# Patient Record
Sex: Female | Born: 1945 | Race: Black or African American | Hispanic: No | Smoking: Current every day smoker
Health system: Southern US, Community
[De-identification: ages and names within clinical notes are randomized; demographics above are authoritative.]

## PROBLEM LIST (undated history)

## (undated) DIAGNOSIS — I6529 Occlusion and stenosis of unspecified carotid artery: Secondary | ICD-10-CM

## (undated) DIAGNOSIS — R911 Solitary pulmonary nodule: Secondary | ICD-10-CM

## (undated) DIAGNOSIS — I10 Essential (primary) hypertension: Secondary | ICD-10-CM

## (undated) DIAGNOSIS — I2510 Atherosclerotic heart disease of native coronary artery without angina pectoris: Secondary | ICD-10-CM

## (undated) DIAGNOSIS — Z720 Tobacco use: Secondary | ICD-10-CM

## (undated) DIAGNOSIS — R200 Anesthesia of skin: Secondary | ICD-10-CM

## (undated) DIAGNOSIS — I219 Acute myocardial infarction, unspecified: Secondary | ICD-10-CM

## (undated) DIAGNOSIS — E785 Hyperlipidemia, unspecified: Secondary | ICD-10-CM

## (undated) HISTORY — DX: Tobacco use: Z72.0

## (undated) HISTORY — DX: Anesthesia of skin: R20.0

## (undated) HISTORY — DX: Essential (primary) hypertension: I10

## (undated) HISTORY — DX: Occlusion and stenosis of unspecified carotid artery: I65.29

## (undated) HISTORY — PX: HIATAL HERNIA REPAIR: SHX195

## (undated) HISTORY — PX: CORONARY STENT PLACEMENT: SHX1402

## (undated) HISTORY — DX: Acute myocardial infarction, unspecified: I21.9

## (undated) HISTORY — DX: Atherosclerotic heart disease of native coronary artery without angina pectoris: I25.10

## (undated) HISTORY — DX: Hyperlipidemia, unspecified: E78.5

## (undated) HISTORY — DX: Solitary pulmonary nodule: R91.1

## (undated) HISTORY — PX: OTHER SURGICAL HISTORY: SHX169

## (undated) HISTORY — PX: ABDOMINAL HYSTERECTOMY: SHX81

---
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
## 2000-02-01 ENCOUNTER — Encounter: Payer: Self-pay | Admitting: Emergency Medicine

## 2000-02-01 ENCOUNTER — Emergency Department (HOSPITAL_COMMUNITY): Admission: EM | Admit: 2000-02-01 | Discharge: 2000-02-01 | Payer: Self-pay

## 2002-07-01 ENCOUNTER — Emergency Department (HOSPITAL_COMMUNITY): Admission: EM | Admit: 2002-07-01 | Discharge: 2002-07-01 | Payer: Self-pay | Admitting: Emergency Medicine

## 2002-07-02 ENCOUNTER — Encounter: Payer: Self-pay | Admitting: Emergency Medicine

## 2007-11-16 ENCOUNTER — Ambulatory Visit: Payer: Self-pay | Admitting: Vascular Surgery

## 2007-11-16 ENCOUNTER — Encounter: Payer: Self-pay | Admitting: Cardiovascular Disease

## 2007-11-16 ENCOUNTER — Inpatient Hospital Stay (HOSPITAL_COMMUNITY): Admission: EM | Admit: 2007-11-16 | Discharge: 2007-11-18 | Payer: Self-pay | Admitting: *Deleted

## 2007-11-16 ENCOUNTER — Ambulatory Visit: Payer: Self-pay | Admitting: Cardiovascular Disease

## 2007-11-17 ENCOUNTER — Encounter: Payer: Self-pay | Admitting: Cardiovascular Disease

## 2010-07-28 NOTE — H&P (Signed)
[name], VARANO NO.:  [id number]   MEDICAL RECORD NO.:  [id number]          PATIENT TYPE:  INP   LOCATION:  1435                         FACILITY:  [facility]   PHYSICIAN:  Eduard Clos, MDDATE OF BIRTH:  07-08-1945   DATE OF ADMISSION:  11/15/2007  DATE OF DISCHARGE:                              HISTORY & PHYSICAL   CHIEF COMPLAINT:  Chest pain.   HISTORY OF PRESENT ILLNESS:  A 65 year old female with history of CAD  status post stenting in 1996 at [facility] with history of ongoing  tobacco abuse, presented to the ER complaining of chest pain.  The  patient states she has been experiencing chest pain for the last three  days, each time lasting up to 3 minutes, occurring at rest, not related  to exertion.  Chest pain is more like a pressure type, occurs sternal,  sometimes radiating to the left arm.  No associated palpitations,  shortness of breath, nausea, vomiting, diaphoresis, dizziness or loss of  consciousness.  Thus, the patient is chest pain free.  The patient  denies any abdominal pain, nausea, nausea, vomiting, fever, chills,  headache, dysuria, or diarrhea.  The patient is admitted for further  observation.   PAST MEDICAL HISTORY:  CAD status post stenting.   PAST SURGICAL HISTORY:  Hysterectomy and stenting in 1996 at [facility].   MEDICATIONS ON ADMISSION:  1. Aspirin 81 mg p.o. daily.   ALLERGIES:  No known drug allergies.   FAMILY HISTORY:  Significant for the patient's father had  MI at age 9.   SOCIAL HISTORY:  The patient smokes cigarettes, has been strongly  advised to quit smoking.  Denies any alcohol or drug abuse.   REVIEW OF SYSTEMS:  Per history of present illness, nothing else  significant.   PHYSICAL EXAMINATION:  GENERAL:  The patient was examined at bedside.  Not in acute distress.  VITAL SIGNS:  Blood pressure 130/80, pulse 90 per minute, temperature  98.8, respirations 18, O2 saturation 99%.  HEENT:  Anicteric, no pallor.  CHEST:  Bilateral air entry present, no rhonchi or crepitus.  HEART:  S1, S2 heard.  ABDOMEN:  Soft, nontender, bowel sounds heard.  No guarding or rigidity.  CNS:  The patient is alert, awake, oriented to time, place and person.  Moves all four extremities 5/5.  EXTREMITIES:  Peripheral pulses felt.   LABORATORY DATA:  EKG normal sinus rhythm.  There is no acute ST-T wave  changes.  Chest x-ray, normal chest. CBC:  WBC 6.6, hemoglobin 12.6,  hematocrit 37, platelets 252.  Basic metabolic panel:  Sodium 142,  potassium 3.4, chloride 108, glucose 130, BUN 15, creatinine 1.  Creatinine kinase 135, CK MB 2.5, troponin 9.   ASSESSMENT/PLAN:  1. Chest pain to rule out acute coronary syndrome (ACS) .  2. History of coronary artery disease,  status post stenting in 1996.  3. Ongoing tobacco abuse.   PLAN:  Admit the patient to telemetry.  Will cycle cardiac markers.  Place the patient on aspirin and metoprolol.  Advised the patient to  quit smoking.  Will place  patient on nitroglycerine (NGT). Further  recommendations as the patient evolves.      Eduard Clos, MD  Electronically Signed     ANK/MEDQ  D:  11/16/2007  T:  11/16/2007  Job:  [id number]

## 2010-07-28 NOTE — Discharge Summary (Signed)
[name], AMSLER             ACCOUNT NO.:  [id number]   MEDICAL RECORD NO.:  [id number]         PATIENT TYPE:  LINP   LOCATION:                               FACILITY:  [facility]   PHYSICIAN:  Hind I Elsaid, MD      DATE OF BIRTH:  [birth year]   DATE OF ADMISSION:  11/16/2007  DATE OF DISCHARGE:  11/18/2007                               DISCHARGE SUMMARY   DISCHARGE DIAGNOSES:  1. Atypical chest pain.  2. Hyperlipidemia.  3. Hypertension.  4. Coronary artery disease status post stenting.  5. Left arm numbness felt to be secondary to disk degeneration.  6. Tobacco abuse.   DISCHARGE MEDICATIONS:  1. Aspirin 325 mg daily.  2. Zocor 410 mg daily.  3. Toprol XL 25 mg daily.   PROCEDURES:  1. Myoview, which was negative for ischemia or infarction.  Ejection      fraction 26%.  2. MRI of the brain negative.  3. MRI of the cervicothoracic spine showed disk herniation at C3 and 4      without apparent neurocompression.  Pronounced spondylosis at C4-C5      with canal narrowing and indentation upon the cord.  Bilateral      neuroforaminal stenosis because of osteotic __________ compress      area of C5 nerve root.  There is spondylosis and posterior lateral      disk protrusion at C5.  C6 with foraminal narrowing on the left      that could compress the C6 nerve root.  There is spondylosis and      protruding disk at C6-C7, more pronounced on the right.   HISTORY OF PRESENT ILLNESS:  This is a 65 year old African American  female with a history of coronary artery disease status post stent  presenting with chest pain and left arm numbness.  Numbness may be  relieved by hand movement.  1. Cardiology consulted from [facility] where they recommended a stress      test.  EKG and cardiac enzymes were negative.  Pain looked      atypical.  The stress test was negative as the report as above.      During hospitalization patient had no further chest pain.  2. Left arm numbness, which is  relieved by moving her arm.  MRI did      show evidence of disk bulging and S1 stenosis.  It was felt that      could be the reason for her symptoms.  Patient was asked to follow      with herself and possible referral to Neurosurgery if symptoms get      worse.  At this time patient admitted her symptoms      completely resolved and procedure can be done electively.  We did      not feel the patient needed to stay any longer in the hospital and      further workup can be done as an outpatient.  We felt that the      patient was medically stable for discharge home.      Hind I  Eda [name], MD  Electronically Signed     HIE/MEDQ  D:  11/18/2007  T:  11/19/2007  Job:  [id number]

## 2010-07-28 NOTE — Consult Note (Signed)
[name], [name] NO.:  [id number]   MEDICAL RECORD NO.:  [id number]          PATIENT TYPE:  INP   LOCATION:  1435                         FACILITY:  [facility]   PHYSICIAN:  Veverly Fells. Excell Seltzer, MD  DATE OF BIRTH:  31-Oct-1945   DATE OF CONSULTATION:  11/16/2007  DATE OF DISCHARGE:                                 CONSULTATION   REASON FOR CONSULTATION:  Chest pain.   REQUESTING PHYSICIAN:  Hind I Elsaid, M.D. with the [facility] service.   HISTORY OF PRESENT ILLNESS:  [name] is a very nice 65 year old  woman who presented early this morning with chest pain.  She was  admitted to through the emergency department.  She has a history of  coronary artery disease with stenting in 1996.  She has done well in the  interim until approximately 1 week ago when she began to feel fatigued.  She describes brief episodes of left-sided chest pressure.  The episodes  last approximately 60 seconds.  She also complains of numbness in the  left arm.  Her symptoms are nonexertional.  She has not had chest pain  today.  She also complains of generalized fatigue over the same time  period.  She denies dyspnea, edema, palpitations or near syncope.  She  has had no regular medical care in some time.  She does not take  medication at home.  She reports that she took aspirin for approximately  1 week after her stent in 1996 but has not taken any antiplatelet drugs  since then.  She had GI upset with aspirin.  Her initial EKG and cardiac  enzymes have been negative.   PAST MEDICAL HISTORY:  1. Coronary artery disease with stenting at [facility] in 1996.      No cardiac follow up since.  2. Total hysterectomy, remotely.  3. Tobacco abuse since age 74.   FAMILY HISTORY:  The patient's father has coronary artery disease.  He  is still living.  His CAD first manifested at age 15.  She has a sister  who has diabetes.  Her mother has no coronary history.   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  She  smokes cigarettes.  She does not drink alcohol.   MEDICATIONS:  Aspirin 81 mg daily.  She started taking this just a few  days ago.   ALLERGIES:  None.   REVIEW OF SYSTEMS:  She reports chronic nausea.  This is actually why  she started smoking cigarettes because they eased her nausea after  eating.  All other systems were reviewed and are negative except as  outlined.  She denies any constitutional symptoms.  She has had no  recent upper respiratory symptoms.  No constipation, diarrhea or other  GI symptoms.  As above, a complete 12-point review of systems was  performed and other systems were negative.   PHYSICAL EXAMINATION:  GENERAL:  The patient is alert and oriented.  She  is in no acute distress.  VITAL SIGNS:  Temperature 98.2, heart rate 80, respiratory rate 18,  blood pressure 144/86,  oxygen saturation 100% on room air.  HEENT:  Normal.  NECK:  Normal carotid upstrokes.  There are no bruits.  JVP normal.  No  thyromegaly or thyroid nodules.  LUNGS:  Clear bilaterally.  HEART:  Regular rate and rhythm.  The apex is discrete and nondisplaced.  There are no murmurs or gallops.  ABDOMEN:  Soft and nontender.  No organomegaly, no abdominal bruits.  BACK:  No CVA tenderness.  EXTREMITIES:  Femoral pulses are 2+.  There are no bruits.  Pedal pulses  are 2+ and equal.  There is no clubbing, cyanosis or edema.  SKIN:  Warm and dry.  There is no rash.  LYMPHATICS:  No adenopathy.  NEUROLOGIC:  Cranial nerves II-XII are intact.  Strength is 5/5 and  equal in the arms and legs.   ELECTROCARDIOGRAM:  EKG shows normal sinus rhythm and is within normal  limits.   LABORATORY DATA:  CK-MB 2.1 with a total CK of 101, troponin 0.01.  Three sets of troponins are 0.02, 0.02, 0.01.  CBC is remarkable for a  hemoglobin of 11.18 and hematocrit of 35.9.  Platelet count is 230,000.  White blood cell count 6.5.  Metabolic panel shows a  creatinine of 0.77  and a BUN of 11.  Chest x-ray is normal.   ASSESSMENT:  This is a 65 year old woman with atypical chest pain.  She  has known coronary artery disease, as described above.  She has ruled  out for a myocardial infarction, and she has no high-risk features.  I  would favor an inpatient myocardial perfusion scan.  An exercise Myoview  stress study will be ordered.  If she has a low risk or a normal study,  she can be treated medically.  If significant ischemia is present, she  will require cardiac catheterization.  Recommend continuation of low-  dose aspirin.  Will check a lipid panel and liver function tests to  determine whether she needs a statin for secondary risk reduction in the  setting of her known coronary artery disease.  I discussed tobacco  cessation with the patient.  Agree with CT scan of the brain to rule out  stroke.  Low level of suspicion since she has no findings except  numbness of the left arm.   Will follow up tomorrow at the time of her stress test.  Appreciate the  opportunity to participate in the care [name].  Please feel free  to call with any questions.      Veverly Fells. Excell Seltzer, MD  Electronically Signed     MDC/MEDQ  D:  11/16/2007  T:  11/16/2007  Job:  [id number]

## 2010-09-04 ENCOUNTER — Emergency Department (HOSPITAL_COMMUNITY)
Admission: EM | Admit: 2010-09-04 | Discharge: 2010-09-04 | Disposition: A | Discharge status: Home / Self Care | Payer: No Typology Code available for payment source | Attending: Emergency Medicine | Admitting: Emergency Medicine

## 2010-09-04 ENCOUNTER — Emergency Department (HOSPITAL_COMMUNITY): Payer: Self-pay

## 2010-09-04 DIAGNOSIS — W010XXA Fall on same level from slipping, tripping and stumbling without subsequent striking against object, initial encounter: Secondary | ICD-10-CM | POA: Insufficient documentation

## 2010-09-04 DIAGNOSIS — Y9229 Other specified public building as the place of occurrence of the external cause: Secondary | ICD-10-CM | POA: Insufficient documentation

## 2010-09-04 DIAGNOSIS — S52599A Other fractures of lower end of unspecified radius, initial encounter for closed fracture: Secondary | ICD-10-CM | POA: Insufficient documentation

## 2010-09-04 DIAGNOSIS — I252 Old myocardial infarction: Secondary | ICD-10-CM | POA: Insufficient documentation

## 2010-09-08 ENCOUNTER — Encounter (HOSPITAL_COMMUNITY)
Admission: RE | Admit: 2010-09-08 | Discharge: 2010-09-08 | Disposition: A | Discharge status: Home / Self Care | Payer: No Typology Code available for payment source | Source: Ambulatory Visit | Attending: Orthopedic Surgery | Admitting: Orthopedic Surgery

## 2010-09-08 LAB — COMPREHENSIVE METABOLIC PANEL
ALT: 10 U/L (ref 0–35)
AST: 16 U/L (ref 0–37)
Albumin: 3.3 g/dL — ABNORMAL LOW (ref 3.5–5.2)
Alkaline Phosphatase: 139 U/L — ABNORMAL HIGH (ref 39–117)
BUN: 22 mg/dL (ref 6–23)
CO2: 29 mEq/L (ref 19–32)
Calcium: 9.2 mg/dL (ref 8.4–10.5)
Chloride: 105 mEq/L (ref 96–112)
Creatinine, Ser: 0.8 mg/dL (ref 0.50–1.10)
GFR calc Af Amer: >60 mL/min (ref >60)
GFR calc non Af Amer: >60 mL/min (ref >60)
Glucose, Bld: 105 mg/dL — ABNORMAL HIGH (ref 70–99)
Potassium: 3.9 mEq/L (ref 3.5–5.1)
Sodium: 140 mEq/L (ref 135–145)
Total Bilirubin: 0.2 mg/dL — ABNORMAL LOW (ref 0.3–1.2)
Total Protein: 6.9 g/dL (ref 6.0–8.3)

## 2010-09-08 LAB — CBC
HCT: 37.2 % (ref 36.0–46.0)
Hemoglobin: 12.3 g/dL (ref 12.0–15.0)
MCH: 29.6 pg (ref 26.0–34.0)
MCHC: 33.1 g/dL (ref 30.0–36.0)
MCV: 89.4 fL (ref 78.0–100.0)
Platelets: 258 10*3/uL (ref 150–400)
RBC: 4.16 MIL/uL (ref 3.87–5.11)
RDW: 14.4 % (ref 11.5–15.5)
WBC: 9.8 10*3/uL (ref 4.0–10.5)

## 2010-09-08 LAB — SURGICAL PCR SCREEN
MRSA, PCR: NEGATIVE
Staphylococcus aureus: NEGATIVE

## 2010-09-09 ENCOUNTER — Ambulatory Visit (HOSPITAL_COMMUNITY)
Admission: RE | Admit: 2010-09-09 | Discharge: 2010-09-09 | Disposition: A | Discharge status: Home / Self Care | Payer: Self-pay | Source: Ambulatory Visit | Attending: Orthopedic Surgery | Admitting: Orthopedic Surgery

## 2010-09-09 DIAGNOSIS — W010XXA Fall on same level from slipping, tripping and stumbling without subsequent striking against object, initial encounter: Secondary | ICD-10-CM | POA: Insufficient documentation

## 2010-09-09 DIAGNOSIS — Z01812 Encounter for preprocedural laboratory examination: Secondary | ICD-10-CM | POA: Insufficient documentation

## 2010-09-09 DIAGNOSIS — Y9229 Other specified public building as the place of occurrence of the external cause: Secondary | ICD-10-CM | POA: Insufficient documentation

## 2010-09-09 DIAGNOSIS — Z01810 Encounter for preprocedural cardiovascular examination: Secondary | ICD-10-CM | POA: Insufficient documentation

## 2010-09-09 DIAGNOSIS — S52599A Other fractures of lower end of unspecified radius, initial encounter for closed fracture: Secondary | ICD-10-CM | POA: Insufficient documentation

## 2010-09-09 DIAGNOSIS — S5410XA Injury of median nerve at forearm level, unspecified arm, initial encounter: Secondary | ICD-10-CM | POA: Insufficient documentation

## 2010-10-06 NOTE — Op Note (Signed)
  NAMESERAYA, JOBST NO.:  [id number]  MEDICAL RECORD NO.:  [id number]  LOCATION:  2899                         FACILITY:  [facility]  PHYSICIAN:  Artist Pais. Norfleet Capers, M.D.DATE OF BIRTH:  [birth year]  DATE OF PROCEDURE:  09/09/2010 DATE OF DISCHARGE:                              OPERATIVE REPORT   PREOPERATIVE DIAGNOSIS:  Displaced intra-articular fracture, distal radius, right side.  POSTOPERATIVE DIAGNOSIS:  Displaced intra-articular fracture, distal radius, right side.  PROCEDURE:  Open reduction and internal fixation above using DVR plate and screws with release of median nerve and tenotomy of brachioradialis tendon.  SURGEON:  Artist Pais. Mina Marble, MD  ASSISTANT:  None.  ANESTHESIA:  General.  TOURNIQUET TIME:  36 minutes.  COMPLICATIONS:  None.  DRAINS:  None.  The patient was taken to operating suite.  After induction of general anesthesia, right upper extremity was prepped and draped in sterile fashion.  An Esmarch was used to exsanguinate the limb.  Tourniquet was inflated to 250 mmHg.  At this point in time, a closed reduction was performed of displaced distal radius fracture.  Once this was done, longitudinal incision was made over the palpable border of flexor carpi radialis tendon.  Skin was incised.  Interval between the FCR and the radial artery was incised.  The sheath overlying the FCR was incised. The FCR was retracted.  Dissection was carried down to the level of the pronator quadratus and the pronator quadratus was subperiosteally stripped off the distal radius revealing an impacted three-part distal radius fracture that had been reduced.  The brachioradialis was carefully released off the proximal and distal fragments along the radial border in maintenance of reduction.  Once this was done, a DVR short plate was placed on the lower aspect of the distal radius, secured to the slotted drill hole with a cortical screw and fluoroscopy  was then used to determine adequate plate position.  Once this was done, the remaining cortical screws were placed proximally followed by smooth pegs distally.  Intraoperative fluoroscopy revealed adequate reduction in AP, lateral, and oblique views.  The wound was irrigated.  The pronator quadratus was closed with 0 Vicryl.  The median nerve was identified and traced in the carpal canal.  A path was created dorsal and volar to the transverse carpal ligament with care to identify and retract the palmar cutaneous nerve.  Once this was done, the transverse carpal ligament was released.  The wound was then irrigated and loosely closed with 3-0 Prolene subcuticular stitch. Steri-Strips, 4x4s, fluffs, and compressive dressing was applied as well as a volar splint.  The patient tolerated the procedure well and went to the recovery room in a stable fashion.     Artist Pais Mina Marble, M.D.     MAW/MEDQ  D:  09/09/2010  T:  09/10/2010  Job:  [id number]  Electronically Signed by Dairl Ponder M.D. on 10/06/2010 08:36:37 PM

## 2010-12-16 LAB — DIFFERENTIAL
Basophils Absolute: 0.1
Basophils Relative: 1
Eosinophils Absolute: 0.1
Eosinophils Relative: 2
Monocytes Absolute: 0.5
Monocytes Relative: 8
Neutro Abs: 2.3

## 2010-12-16 LAB — BASIC METABOLIC PANEL
BUN: 11
Chloride: 109
Creatinine, Ser: 0.77
GFR calc Af Amer: >60
GFR calc non Af Amer: >60
Potassium: 3.7

## 2010-12-16 LAB — CBC
HCT: 35.8 — ABNORMAL LOW
HCT: 35.9 — ABNORMAL LOW
HCT: 36.1
Hemoglobin: 11.8 — ABNORMAL LOW
Hemoglobin: 11.9 — ABNORMAL LOW
MCHC: 33.4
MCV: 91.4
MCV: 91.6
MCV: 92.1
Platelets: 230
RBC: 3.92
RDW: 13.5
RDW: 14.1
WBC: 6.5
WBC: 6.8

## 2010-12-16 LAB — COMPREHENSIVE METABOLIC PANEL
Alkaline Phosphatase: 99
BUN: 9
CO2: 30
Chloride: 108
Creatinine, Ser: 0.83
GFR calc non Af Amer: >60
Glucose, Bld: 107 — ABNORMAL HIGH
Potassium: 4.1
Total Bilirubin: 0.5

## 2010-12-16 LAB — POCT I-STAT, CHEM 8
Calcium, Ion: 1.21
Chloride: 108
Glucose, Bld: 130 — ABNORMAL HIGH
HCT: 37
TCO2: 24

## 2010-12-16 LAB — TROPONIN I
Troponin I: 0.02
Troponin I: 0.02

## 2010-12-16 LAB — CARDIAC PANEL(CRET KIN+CKTOT+MB+TROPI)
Relative Index: 2.1
Total CK: 101
Troponin I: 0.01

## 2010-12-16 LAB — LIPID PANEL
Cholesterol: 231 — ABNORMAL HIGH
Total CHOL/HDL Ratio: 8.9
VLDL: 26

## 2010-12-16 LAB — D-DIMER, QUANTITATIVE: D-Dimer, Quant: <0.22

## 2010-12-16 LAB — CK TOTAL AND CKMB (NOT AT ARMC)
Relative Index: 2
Total CK: 129

## 2010-12-16 LAB — TSH: TSH: 1.459

## 2011-01-08 ENCOUNTER — Inpatient Hospital Stay (HOSPITAL_COMMUNITY)
Admission: EM | Admit: 2011-01-08 | Discharge: 2011-01-09 | DRG: 313 | Disposition: A | Discharge status: Home / Self Care | Payer: Self-pay | Attending: Cardiovascular Disease | Admitting: Cardiovascular Disease

## 2011-01-08 ENCOUNTER — Emergency Department (HOSPITAL_COMMUNITY): Payer: Self-pay

## 2011-01-08 DIAGNOSIS — Z8249 Family history of ischemic heart disease and other diseases of the circulatory system: Secondary | ICD-10-CM

## 2011-01-08 DIAGNOSIS — E785 Hyperlipidemia, unspecified: Secondary | ICD-10-CM | POA: Diagnosis present

## 2011-01-08 DIAGNOSIS — Z91199 Patient's noncompliance with other medical treatment and regimen due to unspecified reason: Secondary | ICD-10-CM

## 2011-01-08 DIAGNOSIS — I2510 Atherosclerotic heart disease of native coronary artery without angina pectoris: Secondary | ICD-10-CM | POA: Diagnosis present

## 2011-01-08 DIAGNOSIS — Z79899 Other long term (current) drug therapy: Secondary | ICD-10-CM

## 2011-01-08 DIAGNOSIS — Z9119 Patient's noncompliance with other medical treatment and regimen: Secondary | ICD-10-CM

## 2011-01-08 DIAGNOSIS — Z833 Family history of diabetes mellitus: Secondary | ICD-10-CM

## 2011-01-08 DIAGNOSIS — I10 Essential (primary) hypertension: Secondary | ICD-10-CM | POA: Diagnosis present

## 2011-01-08 DIAGNOSIS — Z9861 Coronary angioplasty status: Secondary | ICD-10-CM

## 2011-01-08 DIAGNOSIS — F17200 Nicotine dependence, unspecified, uncomplicated: Secondary | ICD-10-CM | POA: Diagnosis present

## 2011-01-08 DIAGNOSIS — Z7982 Long term (current) use of aspirin: Secondary | ICD-10-CM

## 2011-01-08 DIAGNOSIS — R0789 Other chest pain: Principal | ICD-10-CM | POA: Diagnosis present

## 2011-01-08 DIAGNOSIS — R079 Chest pain, unspecified: Secondary | ICD-10-CM

## 2011-01-08 DIAGNOSIS — I252 Old myocardial infarction: Secondary | ICD-10-CM

## 2011-01-08 LAB — APTT: aPTT: 37 seconds (ref 24–37)

## 2011-01-08 LAB — CARDIAC PANEL(CRET KIN+CKTOT+MB+TROPI)
CK, MB: 3.1 ng/mL (ref 0.3–4.0)
Relative Index: 2.6 — ABNORMAL HIGH (ref 0.0–2.5)
Total CK: 119 U/L (ref 7–177)
Troponin I: <0.3 ng/mL (ref <0.30)

## 2011-01-08 LAB — COMPREHENSIVE METABOLIC PANEL
BUN: 14 mg/dL (ref 6–23)
Calcium: 9.7 mg/dL (ref 8.4–10.5)
GFR calc Af Amer: 85 mL/min — ABNORMAL LOW (ref >90)
Glucose, Bld: 107 mg/dL — ABNORMAL HIGH (ref 70–99)
Sodium: 140 mEq/L (ref 135–145)
Total Protein: 7.7 g/dL (ref 6.0–8.3)

## 2011-01-08 LAB — CBC
Hemoglobin: 13 g/dL (ref 12.0–15.0)
MCV: 88.5 fL (ref 78.0–100.0)
Platelets: 289 10*3/uL (ref 150–400)
RBC: 4.42 MIL/uL (ref 3.87–5.11)
WBC: 9.8 10*3/uL (ref 4.0–10.5)

## 2011-01-08 LAB — CK TOTAL AND CKMB (NOT AT ARMC): Total CK: 158 U/L (ref 7–177)

## 2011-01-08 LAB — PROTIME-INR: Prothrombin Time: 12.6 seconds (ref 11.6–15.2)

## 2011-01-08 LAB — DIFFERENTIAL
Eosinophils Absolute: 0.1 10*3/uL (ref 0.0–0.7)
Lymphs Abs: 4.9 10*3/uL — ABNORMAL HIGH (ref 0.7–4.0)
Neutro Abs: 4 10*3/uL (ref 1.7–7.7)
Neutrophils Relative %: 41 % — ABNORMAL LOW (ref 43–77)

## 2011-01-08 LAB — POCT I-STAT TROPONIN I

## 2011-01-08 LAB — D-DIMER, QUANTITATIVE: D-Dimer, Quant: 0.61 ug/mL-FEU — ABNORMAL HIGH (ref 0.00–0.48)

## 2011-01-08 LAB — TSH: TSH: 0.828 u[IU]/mL (ref 0.350–4.500)

## 2011-01-09 ENCOUNTER — Inpatient Hospital Stay (HOSPITAL_COMMUNITY): Payer: Self-pay

## 2011-01-09 ENCOUNTER — Inpatient Hospital Stay (HOSPITAL_COMMUNITY): Payer: No Typology Code available for payment source

## 2011-01-09 DIAGNOSIS — R079 Chest pain, unspecified: Secondary | ICD-10-CM

## 2011-01-09 LAB — LIPID PANEL
Cholesterol: 268 mg/dL — ABNORMAL HIGH (ref 0–200)
LDL Cholesterol: 167 mg/dL — ABNORMAL HIGH (ref 0–99)
Total CHOL/HDL Ratio: 9.2 RATIO
VLDL: 72 mg/dL — ABNORMAL HIGH (ref 0–40)

## 2011-01-09 LAB — COMPREHENSIVE METABOLIC PANEL
AST: 13 U/L (ref 0–37)
Albumin: 2.9 g/dL — ABNORMAL LOW (ref 3.5–5.2)
Alkaline Phosphatase: 115 U/L (ref 39–117)
BUN: 15 mg/dL (ref 6–23)
Chloride: 103 mEq/L (ref 96–112)
Potassium: 3.6 mEq/L (ref 3.5–5.1)
Total Protein: 6.6 g/dL (ref 6.0–8.3)

## 2011-01-09 LAB — CARDIAC PANEL(CRET KIN+CKTOT+MB+TROPI)
CK, MB: 3.2 ng/mL (ref 0.3–4.0)
Relative Index: 3 — ABNORMAL HIGH (ref 0.0–2.5)
Total CK: 108 U/L (ref 7–177)

## 2011-01-09 MED ORDER — TECHNETIUM TC 99M TETROFOSMIN IV KIT
30.0000 | PACK | Freq: Once | INTRAVENOUS | Status: AC | PRN
  Administered 2011-01-09: 30 via INTRAVENOUS

## 2011-01-09 MED ORDER — TECHNETIUM TC 99M TETROFOSMIN IV KIT
10.0000 | PACK | Freq: Once | INTRAVENOUS | Status: AC | PRN
  Administered 2011-01-09: 10 via INTRAVENOUS

## 2011-01-11 ENCOUNTER — Telehealth: Payer: Self-pay | Admitting: Cardiovascular Disease

## 2011-01-11 NOTE — Telephone Encounter (Signed)
Pt call wanting to know if she can get some samples of carvedilol 3.125mg  and lisinopril 20 mg, rosuvastatin 40 mg    Pt said she can get refills to get nitro.   Pt recently laid off job and doesn't have insurance to pay for medications and wants to know if she can get samples of medication until she can afford to buy them herself. Please return pt call to discuss further.

## 2011-01-11 NOTE — Telephone Encounter (Signed)
I spoke with the pt and made her aware that the only samples that we have in the office is Crestor 20mg .  I made the pt aware that she needs to take two of these everyday to equal 40mg  dose (2 week supply given).

## 2011-01-21 ENCOUNTER — Ambulatory Visit (INDEPENDENT_AMBULATORY_CARE_PROVIDER_SITE_OTHER): Payer: No Typology Code available for payment source | Admitting: Physician Assistant

## 2011-01-21 ENCOUNTER — Encounter: Payer: Self-pay | Admitting: Physician Assistant

## 2011-01-21 DIAGNOSIS — I6529 Occlusion and stenosis of unspecified carotid artery: Secondary | ICD-10-CM

## 2011-01-21 DIAGNOSIS — E785 Hyperlipidemia, unspecified: Secondary | ICD-10-CM

## 2011-01-21 DIAGNOSIS — R0989 Other specified symptoms and signs involving the circulatory and respiratory systems: Secondary | ICD-10-CM

## 2011-01-21 DIAGNOSIS — I2510 Atherosclerotic heart disease of native coronary artery without angina pectoris: Secondary | ICD-10-CM

## 2011-01-21 DIAGNOSIS — F17200 Nicotine dependence, unspecified, uncomplicated: Secondary | ICD-10-CM

## 2011-01-21 DIAGNOSIS — R079 Chest pain, unspecified: Secondary | ICD-10-CM

## 2011-01-21 DIAGNOSIS — I10 Essential (primary) hypertension: Secondary | ICD-10-CM

## 2011-01-21 DIAGNOSIS — Z720 Tobacco use: Secondary | ICD-10-CM

## 2011-01-21 HISTORY — DX: Occlusion and stenosis of unspecified carotid artery: I65.29

## 2011-01-21 MED ORDER — SIMVASTATIN 40 MG PO TABS: 40.0000 mg | ORAL_TABLET | Freq: Every evening | ORAL | Status: DC

## 2011-01-21 NOTE — Assessment & Plan Note (Signed)
Patient had admission to the hospital with atypical chest pain.Lexiscan was negative for ischemia. She has had no further chest pain.

## 2011-01-21 NOTE — Progress Notes (Signed)
HPI:  This is a very pleasant 65 year old African American female patient who was recently admitted home hospital with atypical chest pain cardiac risk factors include history of coronary artery disease status post prior PCI in 1996, hypertension, diabetes mellitus, and smokes. She had a Lexiscan that showed no ischemia.  She denies recurrent chest pain. Her blood pressure is elevated today but she hasn't taken any of her medications yet today. She c/o left arm pain that she is asking for a prescription for oxycodone for because she was told not to take so much advil.  She continues to smoke because it's the only way she can keep her food down. She has had stomach problems since 1985 and this is all that helps.   She doesn't watch her sodium intake.   Allergies  Allergen Reactions  . Aspirin     GI Upset    No current outpatient prescriptions on file prior to visit.    Past Medical History  Diagnosis Date  . Chest pain     without objective evidence for ischemia  . Coronary artery disease 1966    Status post prior stenting at [facility]  . Hypertensive urgency   . Hyperlipidemia   . Tobacco abuse   . Arm numbness left     felt to be secondary to disk degeneration  . Myocardial infarction     Past Surgical History  Procedure Date  . Other surgical history      HYSTERECTOMY    Family History  Problem Relation Age of Onset  . Diabetes Mother   . Heart disease Father   . Heart attack Father   . Heart failure Father   . COPD Father     History   Social History  . Marital Status: Legally Separated    Spouse Name: N/A    Number of Children: N/A  . Years of Education: N/A   Occupational History  . Not on file.   Social History Main Topics  . Smoking status: Current Everyday Smoker  . Smokeless tobacco: Not on file  . Alcohol Use: No  . Drug Use: No  . Sexually Active:    Other Topics Concern  . Not on file   Social History Narrative  . No narrative on file      ROS: See HPI Eyes: Negative Ears:Negative for hearing loss, tinnitus Cardiovascular: Negative for chest pain, palpitations,irregular heartbeat, dyspnea, dyspnea on exertion, near-syncope, orthopnea, paroxysmal nocturnal dyspnia and syncope,edema, claudication, cyanosis,.  Respiratory:   Negative for cough, hemoptysis, shortness of breath, sleep disturbances due to breathing, sputum production and wheezing.   Endocrine: Negative for cold intolerance and heat intolerance.  Hematologic/Lymphatic: Negative for adenopathy and bleeding problem. Does not bruise/bleed easily.  Musculoskeletal: Left arm pain.   Gastrointestinal:Chronic stomach problems with trouble digesting food and only helped by smoking.  Neurological: Negative.  Allergic/Immunologic: Negative for environmental allergies.    PHYSICAL EXAM: Well-nournished, in no acute distress. Neck: Right carotid bruit,No JVD, HJR, or thyroid enlargement Lungs: No tachypnea, clear without wheezing, rales, or rhonchi Cardiovascular: RRR, PMI not displaced, heart sounds normal, no murmurs, gallops, bruit, thrill, or heave. Abdomen: BS normal. Soft without organomegaly, masses, lesions or tenderness. Extremities: without cyanosis, clubbing or edema. Good distal pulses bilateral SKin: Warm, no lesions or rashes  Musculoskeletal: No deformities Neuro: no focal signs  BP 152/90  Pulse 96  Ht 5' (1.524 m)  Wt 166 lb 11.2 oz (75.615 kg)  BMI 32.56 kg/m2     Lexiscan  01/09/11 IMPRESSION:  1. Negative for pharmacologic-stress induced ischemia.  2. Left ventricular ejection fraction 59%. 3. Stable small area of anterior wall scar versus breast attenuation.

## 2011-01-21 NOTE — Assessment & Plan Note (Signed)
Patient has a right carotid bruit. Will order carotid Dopplers.

## 2011-01-21 NOTE — Assessment & Plan Note (Signed)
Patient was just started on Crestor 2 weeks ago. She will need fasting lipid panel in one month

## 2011-01-21 NOTE — Patient Instructions (Addendum)
Your physician discussed the hazards of tobacco use. Tobacco use cessation is recommended and techniques and options to help you quit were discussed.  Your physician recommends that you schedule a follow-up appointment in: 1 month with Herma Carson, PA-C (we will call you with for an appointment time)  Your physician has requested that you have a carotid duplex. This test is an ultrasound of the carotid arteries in your neck. It looks at blood flow through these arteries that supply the brain with blood. Allow one hour for this exam. There are no restrictions or special instructions.  Your physician has recommended you make the following change in your medication: STOP Crestor and START Simvastatin 40 mg daily      2 Gram Low Sodium Diet A 2 gram sodium diet restricts the amount of sodium in the diet to no more than 2 g or 2000 mg daily. Limiting the amount of sodium is often used to help lower blood pressure. It is important if you have heart, liver, or kidney problems. Many foods contain sodium for flavor and sometimes as a preservative. When the amount of sodium in a diet needs to be low, it is important to know what to look for when choosing foods and drinks. The following includes some information and guidelines to help make it easier for you to adapt to a low sodium diet. QUICK TIPS  Do not add salt to food.   Avoid convenience items and fast food.   Choose unsalted snack foods.   Buy lower sodium products, often labeled as "lower sodium" or "no salt added."   Check food labels to learn how much sodium is in 1 serving.   When eating at a restaurant, ask that your food be prepared with less salt or none, if possible.  READING FOOD LABELS FOR SODIUM INFORMATION The nutrition facts label is a good place to find how much sodium is in foods. Look for products with no more than 500 to 600 mg of sodium per meal and no more than 150 mg per serving. Remember that 2 g = 2000 mg. The food  label may also list foods as:  Sodium-free: Less than 5 mg in a serving.   Very low sodium: 35 mg or less in a serving.   Low-sodium: 140 mg or less in a serving.   Light in sodium: 50% less sodium in a serving. For example, if a food that usually has 300 mg of sodium is changed to become light in sodium, it will have 150 mg of sodium.   Reduced sodium: 25% less sodium in a serving. For example, if a food that usually has 400 mg of sodium is changed to reduced sodium, it will have 300 mg of sodium.  CHOOSING FOODS Grains  Avoid: Salted crackers and snack items. Some cereals, including instant hot cereals. Bread stuffing and biscuit mixes. Seasoned rice or pasta mixes.   Choose: Unsalted snack items. Low-sodium cereals, oats, puffed wheat and rice, shredded wheat. English muffins and bread. Pasta.  Meats  Avoid: Salted, canned, smoked, spiced, pickled meats, including fish and poultry. Bacon, ham, sausage, cold cuts, hot dogs, anchovies.   Choose: Low-sodium canned tuna and salmon. Fresh or frozen meat, poultry, and fish.  Dairy  Avoid: Processed cheese and spreads. Cottage cheese. Buttermilk and condensed milk. Regular cheese.   Choose: Milk. Low-sodium cottage cheese. Yogurt. Sour cream. Low-sodium cheese.  Fruits and Vegetables  Avoid: Regular canned vegetables. Regular canned tomato sauce and paste. Frozen vegetables  in sauces. Olives. Rosita Fire. Relishes. Sauerkraut.   Choose: Low-sodium canned vegetables. Low-sodium tomato sauce and paste. Frozen or fresh vegetables. Fresh and frozen fruit.  Condiments  Avoid: Canned and packaged gravies. Worcestershire sauce. Tartar sauce. Barbecue sauce. Soy sauce. Steak sauce. Ketchup. Onion, garlic, and table salt. Meat flavorings and tenderizers.   Choose: Fresh and dried herbs and spices. Low-sodium varieties of mustard and ketchup. Lemon juice. Tabasco sauce. Horseradish.  SAMPLE 2 GRAM SODIUM MEAL PLAN Breakfast / Sodium (mg)  1 cup  low-fat milk / 143 mg   2 slices whole-wheat toast / 270 mg   1 tbs heart-healthy margarine / 153 mg   1 hard-boiled egg / 139 mg   1 small orange / 0 mg  Lunch / Sodium (mg)  1 cup raw carrots / 76 mg    cup hummus / 298 mg   1 cup low-fat milk / 143 mg    cup red grapes / 2 mg   1 whole-wheat pita bread / 356 mg  Dinner / Sodium (mg)  1 cup whole-wheat pasta / 2 mg   1 cup low-sodium tomato sauce / 73 mg   3 oz lean ground beef / 57 mg   1 small side salad (1 cup raw spinach leaves,  cup cucumber,  cup yellow bell pepper) with 1 tsp olive oil and 1 tsp red wine vinegar / 25 mg  Snack / Sodium (mg)  1 container low-fat vanilla yogurt / 107 mg   3 graham cracker squares / 127 mg  Nutrient Analysis  Calories: 2033   Protein: 77 g   Carbohydrate: 282 g   Fat: 72 g   Sodium: 1971 mg  Document Released: 03/01/2005 Document Revised: 11/11/2010 Document Reviewed: 06/02/2009 [facility] Patient Information 2012 [place], [place].

## 2011-01-21 NOTE — Assessment & Plan Note (Addendum)
Patient's blood pressure is elevated today. She has not taken her medications today. I have counseled her on a 2 g sodium diet. She just bought a blood pressure cuff and will keep track of her blood pressure and call his elevated.  Patient needs to establish with a primary M.D. We'll refer her to our [place] office.

## 2011-02-24 ENCOUNTER — Encounter (INDEPENDENT_AMBULATORY_CARE_PROVIDER_SITE_OTHER): Payer: Self-pay | Admitting: Cardiology

## 2011-02-24 DIAGNOSIS — R55 Syncope and collapse: Secondary | ICD-10-CM

## 2011-02-24 DIAGNOSIS — I6529 Occlusion and stenosis of unspecified carotid artery: Secondary | ICD-10-CM

## 2011-02-24 DIAGNOSIS — R0989 Other specified symptoms and signs involving the circulatory and respiratory systems: Secondary | ICD-10-CM

## 2011-02-25 ENCOUNTER — Encounter: Payer: Self-pay | Admitting: Physician Assistant

## 2011-02-25 ENCOUNTER — Ambulatory Visit (INDEPENDENT_AMBULATORY_CARE_PROVIDER_SITE_OTHER): Payer: Self-pay | Admitting: Physician Assistant

## 2011-02-25 ENCOUNTER — Telehealth: Payer: Self-pay | Admitting: Physician Assistant

## 2011-02-25 DIAGNOSIS — I10 Essential (primary) hypertension: Secondary | ICD-10-CM

## 2011-02-25 DIAGNOSIS — I6529 Occlusion and stenosis of unspecified carotid artery: Secondary | ICD-10-CM

## 2011-02-25 DIAGNOSIS — E785 Hyperlipidemia, unspecified: Secondary | ICD-10-CM

## 2011-02-25 DIAGNOSIS — I2510 Atherosclerotic heart disease of native coronary artery without angina pectoris: Secondary | ICD-10-CM

## 2011-02-25 MED ORDER — FAMOTIDINE 20 MG PO TABS: 20.0000 mg | ORAL_TABLET | Freq: Two times a day (BID) | ORAL | Status: DC

## 2011-02-25 NOTE — Assessment & Plan Note (Signed)
Uncontrolled.  However, she has not taken her medicines yet today. I advised to take her medicines prior to coming into the office for future visits.  She checks her blood pressures at home and they are optimal.

## 2011-02-25 NOTE — Assessment & Plan Note (Signed)
Stable.  No angina.  I will give her Pepcid 20 mg twice daily to take.  I have asked her to try aspirin 81 mg a day with Pepcid to see if she can tolerate it.  Otherwise, we will try to get her on Plavix.  Followup with Dr. Tonny Bollman in 6 months.

## 2011-02-25 NOTE — Assessment & Plan Note (Signed)
Simvastatin started last visit.  Check FLP and LFTs in 4-6 weeks.

## 2011-02-25 NOTE — Progress Notes (Signed)
  [address] [place], [place]  [address] Phone: [phone] Fax:  [phone]  Date:  02/25/2011   Name:  [name]       DOB:  07/28/1945 MRN:  [id number]  PCP:  None Primary Cardiologist:  Dr. Tonny Bollman    History of Present Illness: [name] is a 65 y.o. female who presents for follow up.  She was seen by Jeannie Done, PA-C on 11/18 for post hospitalization followup.  She had recently been admitted for chest pain.  She has a history of CAD prior stenting of an unknown vessel at [facility] in [place].  Other history includes hypertension, diabetes or tobacco abuse.  Her blood pressure was significantly elevated at her last visit.  However, she had not taken any of her medications.  She was also noted to have a carotid bruit.  She was referred to primary care.  Results of her carotid Doppler performed yesterday are still pending.  Since last being seen, she has been well.  She denies chest pain, shortness of breath, orthopnea, PND, edema, syncope.  He feels tired.  She did not take aspirin because of GI upset.  Past Medical History  Diagnosis Date  . Coronary artery disease 1966    Status post prior stenting at [facility] (unknown vessel);  Myoview 12/2010:  EF 59%, no ischemia, small anterior wall scar vs breast attenuation  . HTN (hypertension)   . Hyperlipidemia   . Tobacco abuse   . Arm numbness left     felt to be secondary to disk degeneration    Current Outpatient Prescriptions  Medication Sig Dispense Refill  . carvedilol (COREG) 3.125 MG tablet Take 3.125 mg by mouth 2 (two) times daily with a meal.        . lisinopril (PRINIVIL,ZESTRIL) 20 MG tablet Take 20 mg by mouth daily.        . nitroGLYCERIN (NITROSTAT) 0.4 MG SL tablet Place 0.4 mg under the tongue every 5 (five) minutes as needed.        . simvastatin (ZOCOR) 40 MG tablet Take 1 tablet (40 mg total) by mouth every evening.   30 tablet  6    Allergies: Allergies  Allergen Reactions  . Aspirin     GI Upset    History  Substance Use Topics  . Smoking status: Current Everyday Smoker  . Smokeless tobacco: Not on file  . Alcohol Use: No     ROS:  Please see the history of present illness.   She denies a history of snoring, apneic episodes and daytime hypersomnolence.  All other systems reviewed and negative.   PHYSICAL EXAM: VS:  BP 152/89  Pulse 84  Ht 5' (1.524 m)  Wt 159 lb 1.9 oz (72.176 kg)  BMI 31.08 kg/m2 Repeat blood pressure by me 140/88 on the left  Well nourished, well developed, in no acute distress HEENT: normal Neck: no JVD Cardiac:  normal S1, S2; RRR; no murmur Lungs:  clear to auscultation bilaterally, no wheezing, rhonchi or rales Abd: soft, nontender, no hepatomegaly Ext: no edema Skin: warm and dry Neuro:  CNs 2-12 intact, no focal abnormalities noted  EKG:   Sinus rhythm, heart rate 76, normal axis, no ischemic changes.  ASSESSMENT AND PLAN:

## 2011-02-25 NOTE — Assessment & Plan Note (Signed)
40-59% bilateral ICA by recent dopplers.  Repeat in one year.

## 2011-02-25 NOTE — Telephone Encounter (Addendum)
ROI faxed to [phone] to obtain Records 02/25/11/   Pt called back and said [facility] is Not where she was Seen it was  [facility]..Marland KitchenSo ROI was faxed to [phone] 03/02/11/km  Spoke with Medical Records Dept @ [facility], they are stating they have  No records on this Pt Let Okey Regal know 04/15/11/KM

## 2011-02-25 NOTE — Patient Instructions (Addendum)
Call 1-800-QUIT-NOW (219-052-7727) for help with quitting smoking.  You can also call [phone] to help with quitting.  BE SURE TO TAKE ALL OF YOUR BLOOD PRESSURE MEDICATIONS BEFORE YOUR NEXT APPT, THIS HELPS THE DOCTOR SEE IF YOUR MEDICATION IS WORKING FOR YOU OR NOT  Your physician has recommended you make the following change in your medication: START PEPCID 20 MG 1 TABLET TWICE DAILY AFTER 1 WEEK OF TAKING THE PEPCID START TAKING THE 81 MG COATED ASPIRIN AND SEE IF YOU CAN TOLERATE THIS.  Your physician recommends that you return for lab work in: FASTING LIPID/LIVER PANEL 4-6 WEEKS  Your physician has requested that you have a BILATERAL carotid duplex, THIS IS TO BE DONE IN 1 YEAR. This test is an ultrasound of the carotid arteries in your neck. It looks at blood flow through these arteries that supply the brain with blood. Allow one hour for this exam. There are no restrictions or special instructions.   Your physician wants you to follow-up in: 6 MONTHS WITH DR. Excell Seltzer. You will receive a reminder letter in the mail two months in advance. If you don't receive a letter, please call our office to schedule the follow-up appointment.  You have been referred to TO ESTABLISH WITH A PRIMARY CARE PHYSICIAN

## 2011-02-25 NOTE — Assessment & Plan Note (Signed)
We discussed strategies for quitting.

## 2011-03-29 ENCOUNTER — Other Ambulatory Visit (INDEPENDENT_AMBULATORY_CARE_PROVIDER_SITE_OTHER): Payer: Self-pay | Admitting: *Deleted

## 2011-03-29 ENCOUNTER — Other Ambulatory Visit: Payer: Self-pay | Admitting: Physician Assistant

## 2011-03-29 DIAGNOSIS — I10 Essential (primary) hypertension: Secondary | ICD-10-CM

## 2011-03-29 DIAGNOSIS — K8020 Calculus of gallbladder without cholecystitis without obstruction: Secondary | ICD-10-CM

## 2011-03-29 DIAGNOSIS — E785 Hyperlipidemia, unspecified: Secondary | ICD-10-CM

## 2011-03-29 DIAGNOSIS — I6529 Occlusion and stenosis of unspecified carotid artery: Secondary | ICD-10-CM

## 2011-03-29 DIAGNOSIS — R748 Abnormal levels of other serum enzymes: Secondary | ICD-10-CM

## 2011-03-29 DIAGNOSIS — I2510 Atherosclerotic heart disease of native coronary artery without angina pectoris: Secondary | ICD-10-CM

## 2011-03-29 LAB — LIPID PANEL
Cholesterol: 229 mg/dL — ABNORMAL HIGH (ref 0–200)
Triglycerides: 144 mg/dL (ref 0.0–149.0)
VLDL: 28.8 mg/dL (ref 0.0–40.0)

## 2011-03-29 LAB — LDL CHOLESTEROL, DIRECT: Direct LDL: 154.8 mg/dL

## 2011-03-29 LAB — HEPATIC FUNCTION PANEL
AST: 20 U/L (ref 0–37)
Albumin: 3.3 g/dL — ABNORMAL LOW (ref 3.5–5.2)
Alkaline Phosphatase: 171 U/L — ABNORMAL HIGH (ref 39–117)
Total Protein: 7.1 g/dL (ref 6.0–8.3)

## 2011-03-31 ENCOUNTER — Telehealth: Payer: Self-pay | Admitting: Cardiovascular Disease

## 2011-03-31 DIAGNOSIS — E7800 Pure hypercholesterolemia, unspecified: Secondary | ICD-10-CM

## 2011-03-31 MED ORDER — SIMVASTATIN 40 MG PO TABS: 40.0000 mg | ORAL_TABLET | Freq: Every evening | ORAL | Status: DC

## 2011-03-31 NOTE — Telephone Encounter (Signed)
Called and spoke with patient to giver her the lab results from [facility]  She reports that she has not been taking the Simvastatin.  I have called in a new rx for her for the 40mg  tablet and called her and let her know I did so.  She is to start taking Simvastatin 40mg  every night and re-check her labs in 8 weeks  She also does not have a PCP and will need to ne referred to [facility].  I have left patient a message in regards to the above and asked her to call me back tomorrow

## 2011-03-31 NOTE — Telephone Encounter (Signed)
Fu call °Patient returning your call from yesterday °

## 2011-03-31 NOTE — Telephone Encounter (Signed)
Pt rtn call from yesterday doesn't know who called or why, very anxious about it, pls call [phone]

## 2011-04-01 NOTE — Progress Notes (Signed)
Since she needs referral to PCP, will start work up for elevated ALP. Have her come in for repeat LFTs and also get a GGT level. Tereso Newcomer, PA-C  2:07 PM 04/01/2011

## 2011-04-01 NOTE — Telephone Encounter (Signed)
Left a message to pt that she needs to have LFT and GGT blood work on Monday 04/04/10. Labs orders and appointment is in. she will be referred to PCP in [facility] for Gall stones after lab results. She is to have Fasting lipids LFT in 8 weeks after restarting Simvastatin 40 mg every night. Orders and appointment is in for March 13 th 2013. Patient to call back to make sure it she has any questions.

## 2011-04-01 NOTE — Progress Notes (Signed)
Addended by: Harriet Butte on: 04/01/2011 03:54 PM   Modules accepted: Orders

## 2011-04-05 ENCOUNTER — Other Ambulatory Visit (INDEPENDENT_AMBULATORY_CARE_PROVIDER_SITE_OTHER): Payer: Self-pay | Admitting: *Deleted

## 2011-04-05 DIAGNOSIS — R748 Abnormal levels of other serum enzymes: Secondary | ICD-10-CM

## 2011-04-05 DIAGNOSIS — K8020 Calculus of gallbladder without cholecystitis without obstruction: Secondary | ICD-10-CM

## 2011-04-05 DIAGNOSIS — E7800 Pure hypercholesterolemia, unspecified: Secondary | ICD-10-CM

## 2011-04-05 LAB — LIPID PANEL
HDL: 36.2 mg/dL — ABNORMAL LOW (ref >39.00)
Triglycerides: 174 mg/dL — ABNORMAL HIGH (ref 0.0–149.0)

## 2011-04-05 LAB — LDL CHOLESTEROL, DIRECT: Direct LDL: 176 mg/dL

## 2011-04-05 LAB — HEPATIC FUNCTION PANEL
Albumin: 3.2 g/dL — ABNORMAL LOW (ref 3.5–5.2)
Total Protein: 6.7 g/dL (ref 6.0–8.3)

## 2011-04-06 ENCOUNTER — Other Ambulatory Visit: Payer: Self-pay | Admitting: Physician Assistant

## 2011-04-06 DIAGNOSIS — E785 Hyperlipidemia, unspecified: Secondary | ICD-10-CM

## 2011-04-07 ENCOUNTER — Telehealth: Payer: Self-pay | Admitting: Cardiovascular Disease

## 2011-04-07 NOTE — Telephone Encounter (Signed)
Pt has been scheduled for abdominal US and Omar Person called the pt about appointment.

## 2011-04-07 NOTE — Telephone Encounter (Signed)
New Problem   Patient returning a call to nurse, please return call to patient at hm#

## 2011-04-07 NOTE — Telephone Encounter (Signed)
I did not call this pt.  The pt does not know who called her from our office.  Danielle Rankin spoke with the pt on 04/06/11 and sent an order to [facility] to schedule abdominal US.  I spoke with everyone in [facility] and they have not called the pt.  I will have the [facility] schedule this test and call the pt back.

## 2011-04-12 ENCOUNTER — Ambulatory Visit (HOSPITAL_COMMUNITY)
Admission: RE | Admit: 2011-04-12 | Discharge: 2011-04-12 | Disposition: A | Discharge status: Home / Self Care | Payer: Self-pay | Source: Ambulatory Visit | Attending: Cardiovascular Disease | Admitting: Cardiovascular Disease

## 2011-04-12 DIAGNOSIS — R935 Abnormal findings on diagnostic imaging of other abdominal regions, including retroperitoneum: Secondary | ICD-10-CM | POA: Insufficient documentation

## 2011-04-12 DIAGNOSIS — R7989 Other specified abnormal findings of blood chemistry: Secondary | ICD-10-CM | POA: Insufficient documentation

## 2011-05-13 ENCOUNTER — Other Ambulatory Visit: Payer: Self-pay

## 2011-05-26 ENCOUNTER — Other Ambulatory Visit: Payer: Self-pay | Admitting: *Deleted

## 2011-05-26 ENCOUNTER — Other Ambulatory Visit: Payer: Self-pay

## 2011-06-01 ENCOUNTER — Other Ambulatory Visit (INDEPENDENT_AMBULATORY_CARE_PROVIDER_SITE_OTHER): Payer: Self-pay

## 2011-06-01 DIAGNOSIS — E785 Hyperlipidemia, unspecified: Secondary | ICD-10-CM

## 2011-06-01 LAB — LIPID PANEL
HDL: 39.4 mg/dL (ref >39.00)
Triglycerides: 180 mg/dL — ABNORMAL HIGH (ref 0.0–149.0)
VLDL: 36 mg/dL (ref 0.0–40.0)

## 2011-06-04 ENCOUNTER — Telehealth: Payer: Self-pay | Admitting: Cardiovascular Disease

## 2011-06-04 NOTE — Telephone Encounter (Signed)
I spoke with the pt about the results of her lipid panel.  The pt said she is not taking her Simvastatin on a regular basis.  The pt said she just forgets to take the medication.  I recommended that the pt get a weekly pill box to help with medication compliance.  I reinforced the importance of taking Simvastatin.  I did go ahead and schedule the pt to see Dr Excell Seltzer in June and made her aware that we will recheck her labs at that appointment.

## 2011-06-04 NOTE — Telephone Encounter (Signed)
Fu call °Patient returning your call °

## 2011-07-01 ENCOUNTER — Telehealth: Payer: Self-pay | Admitting: Cardiovascular Disease

## 2011-07-01 NOTE — Telephone Encounter (Signed)
Patient called because she has been having a sharp pain on her left arm for the last 3 weeks. Patient denies chest pain, pressure, cold sweats,  nauseas or vomit.  Pt said when she raises the arm up the pain goes away, when she has it down and rest on her elbow the pain comes back. Patient think that it  could be a pinched nerve. Patient will make an appointment with her PCP to be seen. Patient is aware to call the office if needed.

## 2011-07-01 NOTE — Telephone Encounter (Signed)
New msg Pt called and said she has been having left arm pain for 3 weeks. No chest pain or sob. She has been taking tylenol. Please call

## 2011-07-22 ENCOUNTER — Ambulatory Visit: Payer: Self-pay | Admitting: Internal Medicine

## 2011-08-25 ENCOUNTER — Encounter: Payer: Self-pay | Admitting: Cardiovascular Disease

## 2011-08-25 ENCOUNTER — Ambulatory Visit (INDEPENDENT_AMBULATORY_CARE_PROVIDER_SITE_OTHER): Payer: Self-pay | Admitting: Cardiovascular Disease

## 2011-08-25 VITALS — BP 140/88 | HR 72 | Ht 60.0 in | Wt 162.8 lb

## 2011-08-25 DIAGNOSIS — I2510 Atherosclerotic heart disease of native coronary artery without angina pectoris: Secondary | ICD-10-CM

## 2011-08-25 DIAGNOSIS — I6529 Occlusion and stenosis of unspecified carotid artery: Secondary | ICD-10-CM

## 2011-08-25 DIAGNOSIS — I10 Essential (primary) hypertension: Secondary | ICD-10-CM

## 2011-08-25 MED ORDER — CARVEDILOL 6.25 MG PO TABS: 6.2500 mg | ORAL_TABLET | Freq: Two times a day (BID) | ORAL | Status: DC

## 2011-08-25 NOTE — Patient Instructions (Addendum)
Your physician wants you to follow-up in: 12 months. You will receive a reminder letter in the mail two months in advance. If you don't receive a letter, please call our office to schedule the follow-up appointment.  Your physician has recommended you make the following change in your medication: Increase coreg to 6.25 mg by mouth twice daily

## 2011-08-25 NOTE — Progress Notes (Signed)
HPI:  66 year old woman presenting for followup evaluation. The patient has a remote history of coronary artery disease and underwent coronary stenting in 1996. She is also followed for hypertension, diabetes, and tobacco abuse. She had a nuclear stress test in 2012 after being hospitalized for chest pain. The study was negative for ischemia and showed a left ventricular ejection fraction of 59%.  The patient feels well. She has no complaints today. She specifically denies chest pain, chest pressure, dyspnea, or edema. She is back to work at [employer] and she is enjoying working again. She continues to smoke cigarettes about 3-4 per day. She does not seem interested in quitting. I don't get the sense that she's taking her medicines regularly. She tells me that she takes her Coreg at night but not in the morning.  Outpatient Encounter Prescriptions as of 08/25/2011  Medication Sig Dispense Refill  . aspirin EC 81 MG tablet Take 1 tablet (81 mg total) by mouth daily.      . carvedilol (COREG) 3.125 MG tablet Take 3.125 mg by mouth 2 (two) times daily with a meal.        . lisinopril (PRINIVIL,ZESTRIL) 20 MG tablet Take 20 mg by mouth daily.        . nitroGLYCERIN (NITROSTAT) 0.4 MG SL tablet Place 0.4 mg under the tongue every 5 (five) minutes as needed.        . simvastatin (ZOCOR) 40 MG tablet Take 40 mg by mouth as needed.      Marland Kitchen DISCONTD: famotidine (PEPCID) 20 MG tablet Take 1 tablet (20 mg total) by mouth 2 (two) times daily.  60 tablet  11  . DISCONTD: simvastatin (ZOCOR) 40 MG tablet Take 1 tablet (40 mg total) by mouth every evening.  30 tablet  6    Allergies  Allergen Reactions  . Aspirin     GI Upset    Past Medical History  Diagnosis Date  . Coronary artery disease 1966    Status post prior stenting at [facility] (unknown vessel);  Myoview 12/2010:  EF 59%, no ischemia, small anterior wall scar vs breast attenuation  . HTN (hypertension)   . Hyperlipidemia   . Tobacco abuse    . Arm numbness left     felt to be secondary to disk degeneration    ROS: Negative except as per HPI  BP 140/88  Pulse 72  Ht 5' (1.524 m)  Wt 73.846 kg (162 lb 12.8 oz)  BMI 31.79 kg/m2  PHYSICAL EXAM: Pt is alert and oriented, NAD HEENT: normal Neck: JVP - normal, carotids 2+= without bruits Lungs: CTA bilaterally CV: RRR without murmur or gallop Abd: soft, NT, Positive BS, no hepatomegaly Ext: no C/C/E, distal pulses intact and equal Skin: warm/dry no rash  EKG:  Normal sinus rhythm 72 beats per minute, nonspecific T wave abnormality  ASSESSMENT AND PLAN: 1. Coronary artery disease. Recent nuclear scan reviewed and showed no ischemia. Continue current medical management. The patient is taking her low-dose aspirin. Her cholesterol medicine has been taken sporadically. We reinforced the importance of taking her medicines as prescribed.  2. Hypertension. Increase Coreg to 6.25 mg twice daily. Continue lisinopril 20 mg daily. Blood pressure is borderline today.  3. Dyslipidemia. Reinforced the importance of taking simvastatin on daily basis. Lipids are way above goal with the most recent LDL of 168. She is going to establish with Dr. Yetta Barre in the near future and I suspect she will have followup of her blood  work when she sees him.  4. Carotid stenosis. The patient had 40-59% bilateral ICA stenosis. She is due for followup carotid Dopplers in 1 year. Again stressed the importance of taking her statin medicine, daily aspirin, and discontinuing cigarettes.  Tonny Bollman 08/25/2011 9:16 AM

## 2011-09-07 ENCOUNTER — Ambulatory Visit: Payer: Self-pay | Admitting: Internal Medicine

## 2011-09-07 DIAGNOSIS — Z0289 Encounter for other administrative examinations: Secondary | ICD-10-CM

## 2012-02-17 IMAGING — US US ABDOMEN COMPLETE
1 series · 14 of 25 positions shown · non-contrast
Comparison: Report from prior ultrasound 07/02/2002.

CLINICAL DATA: Elevated LFTs.

COMPLETE ABDOMINAL ULTRASOUND

[Series 1: us abdomen complete · 0.23mm/px · 14 of 68 slices shown]
[im 1/68]
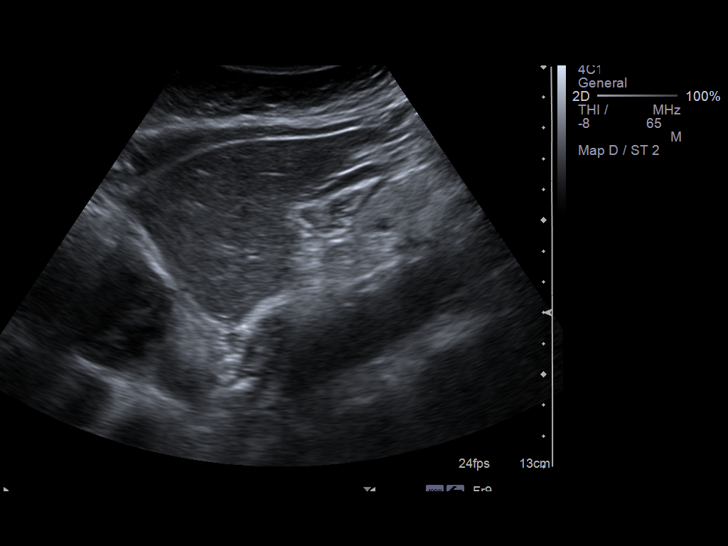
[im 6/68]
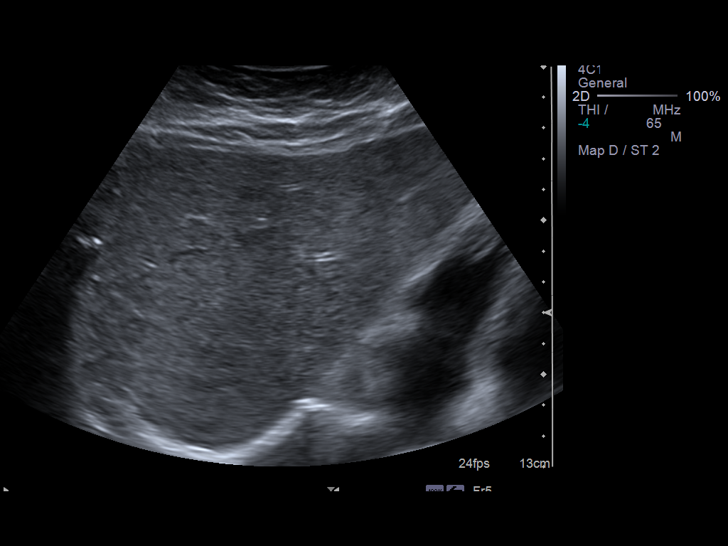
[im 12/68]
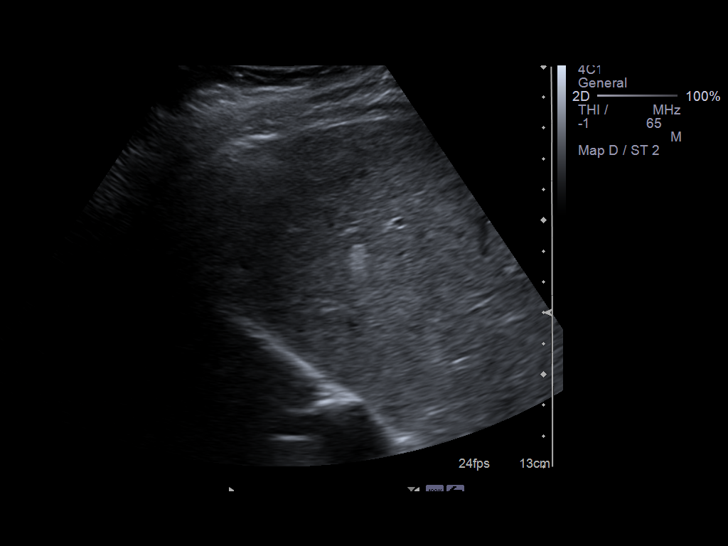
[im 17/68]
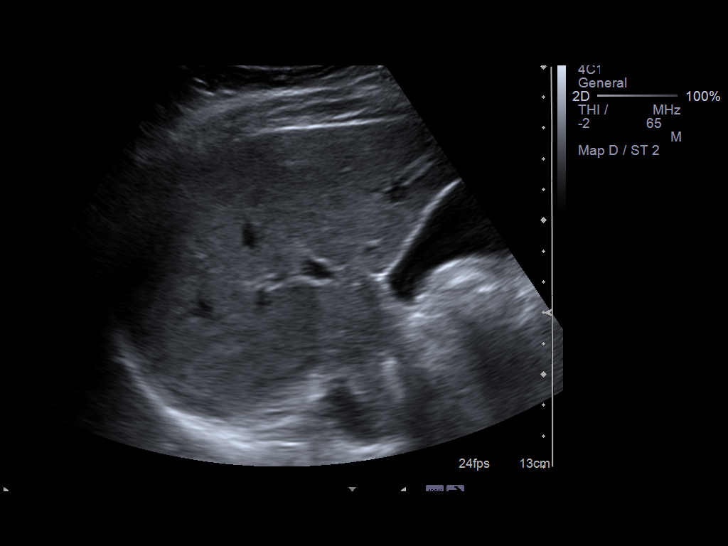
[im 23/68]
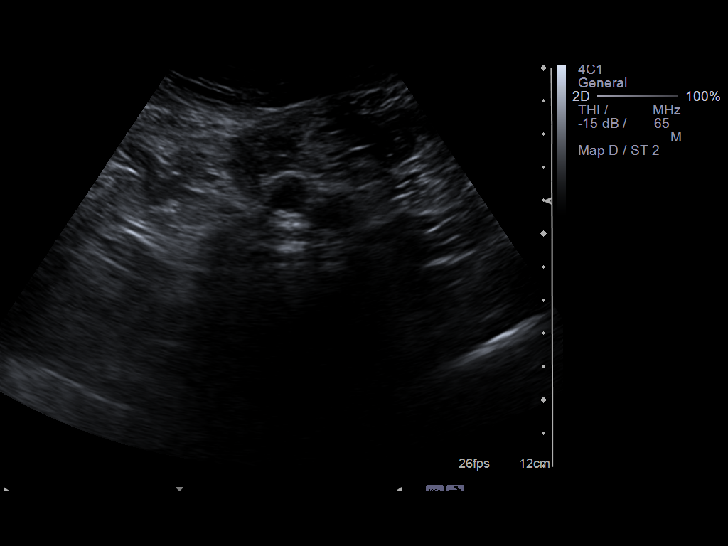
[im 26/68]
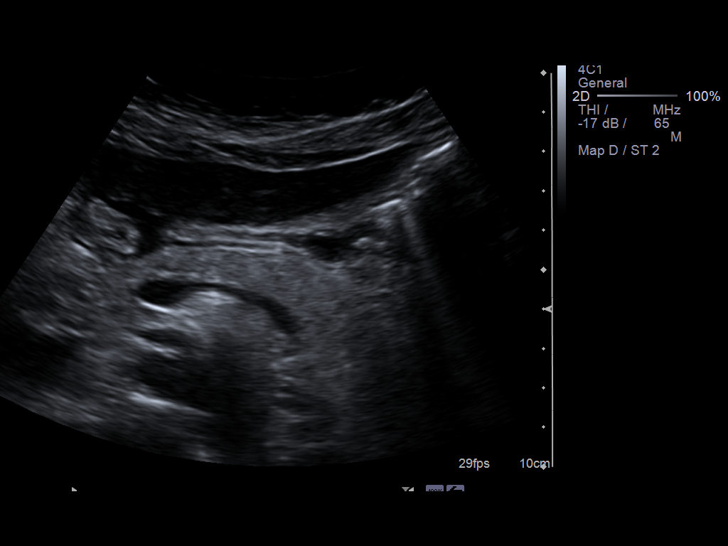
[im 31/68]
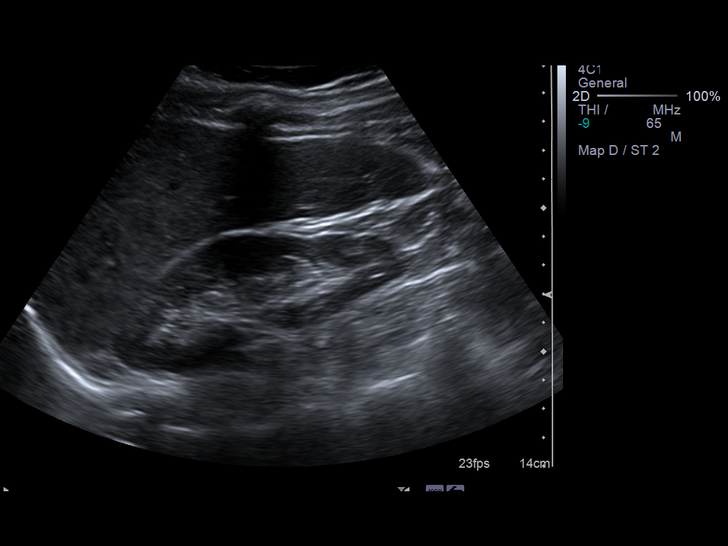
[im 37/68]
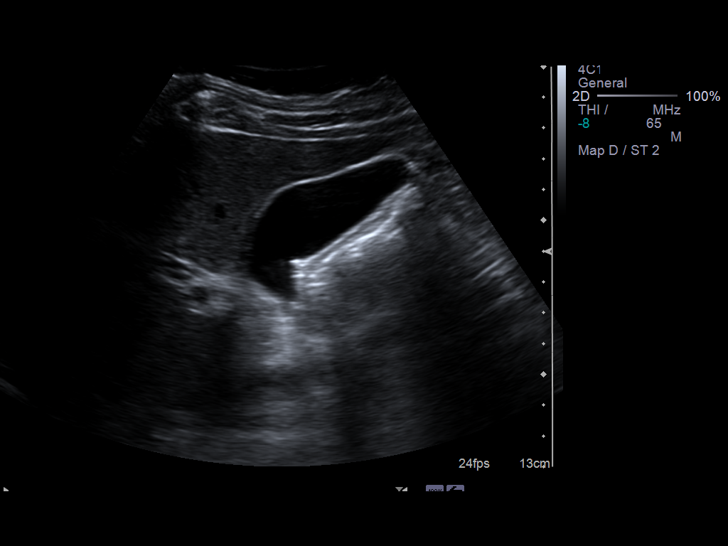
[im 42/68]
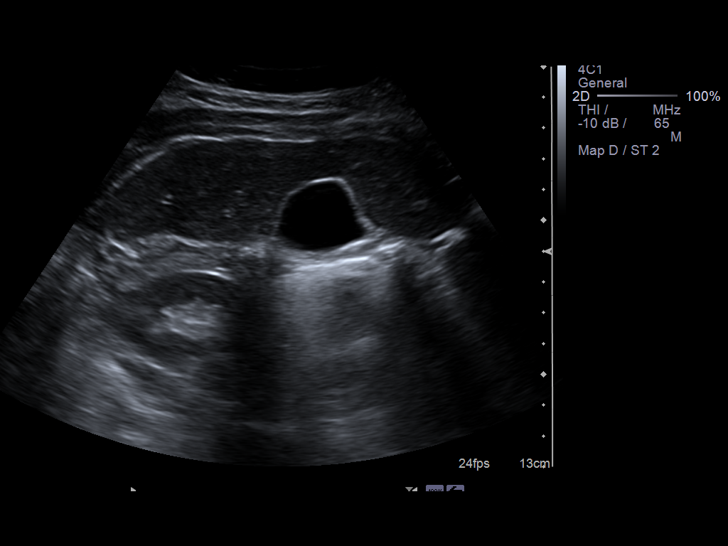
[im 45/68]
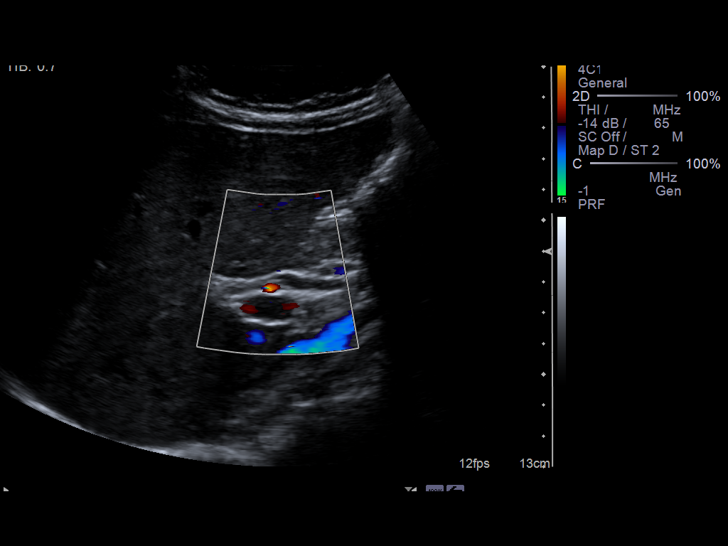
[im 51/68]
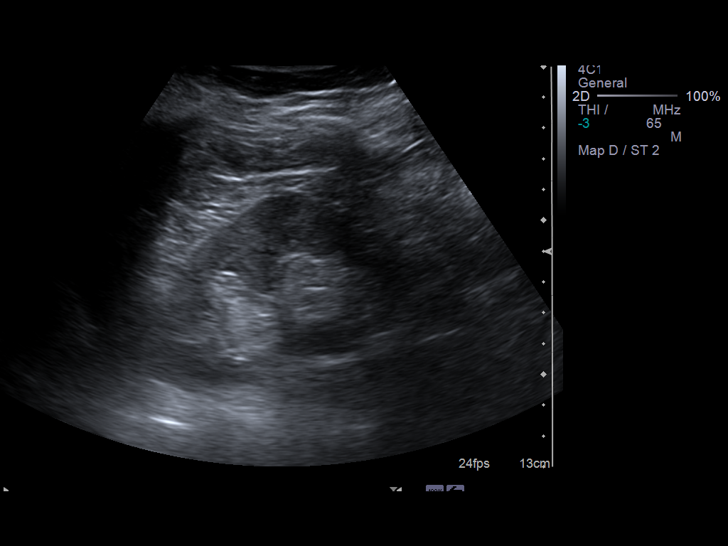
[im 56/68]
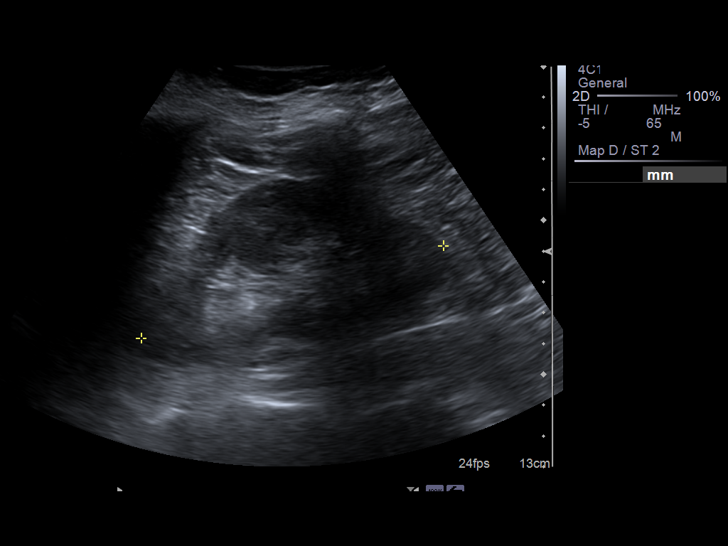
[im 62/68]
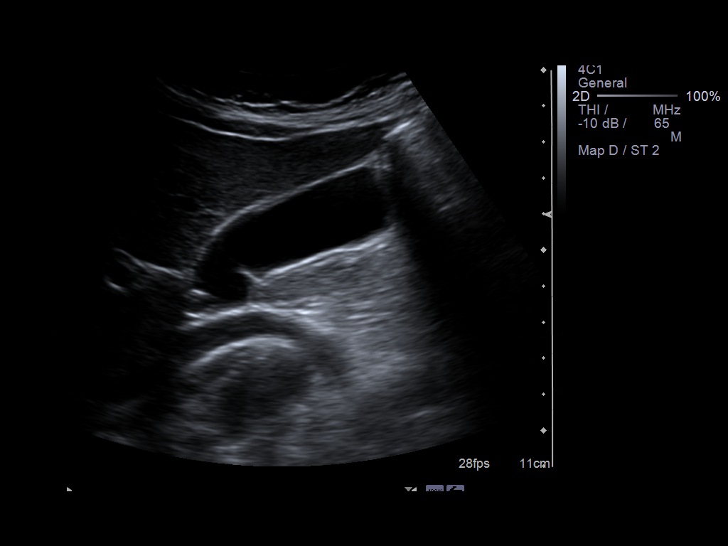
[im 68/68]
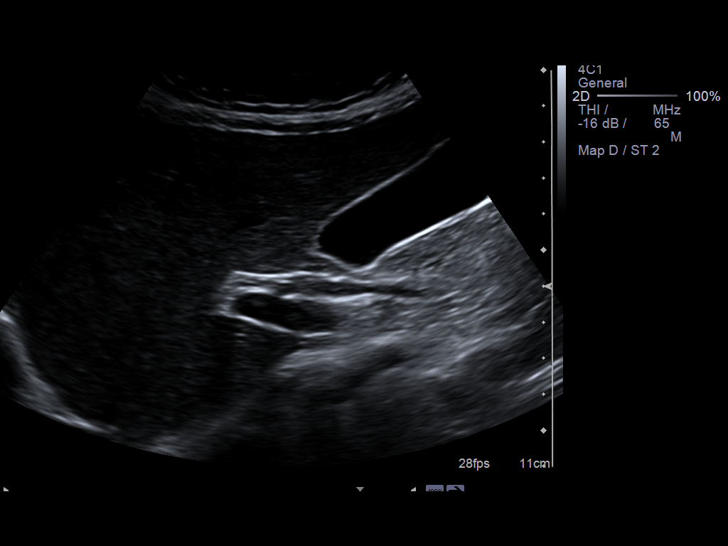

[14 of 25 positions shown; findings below may reference images not displayed]

FINDINGS: Gallbladder:  No gallstones, gallbladder wall thickening, or
pericholecystic fluid.

Common bile duct:   Normal caliber, 4 mm.

Liver:  1 cm echogenic area within the right hepatic lobe most
compatible with small hemangioma.  No biliary ductal dilatation.

IVC:  Appears normal.

Pancreas:  No focal abnormality seen.

Spleen:  Within normal limits in size and echotexture.

Right Kidney:   Normal in size and parenchymal echogenicity.  No
evidence of mass or hydronephrosis.

Left Kidney:  Normal in size and parenchymal echogenicity.  No
evidence of mass or hydronephrosis.

Abdominal aorta:  No aneurysm identified.
IMPRESSION: No cholelithiasis or evidence of acute cholecystitis.

Small echogenic area within the liver, likely small hemangioma.

No acute findings.

## 2012-06-08 ENCOUNTER — Encounter (HOSPITAL_COMMUNITY): Payer: Self-pay | Admitting: Nurse Practitioner

## 2012-06-08 ENCOUNTER — Emergency Department (HOSPITAL_COMMUNITY): Payer: PRIVATE HEALTH INSURANCE

## 2012-06-08 ENCOUNTER — Other Ambulatory Visit: Payer: Self-pay

## 2012-06-08 ENCOUNTER — Emergency Department (HOSPITAL_COMMUNITY)
Admission: EM | Admit: 2012-06-08 | Discharge: 2012-06-08 | Disposition: A | Discharge status: Home / Self Care | Payer: PRIVATE HEALTH INSURANCE | Attending: Emergency Medicine | Admitting: Emergency Medicine

## 2012-06-08 DIAGNOSIS — R296 Repeated falls: Secondary | ICD-10-CM | POA: Insufficient documentation

## 2012-06-08 DIAGNOSIS — Y929 Unspecified place or not applicable: Secondary | ICD-10-CM | POA: Insufficient documentation

## 2012-06-08 DIAGNOSIS — Z9861 Coronary angioplasty status: Secondary | ICD-10-CM | POA: Insufficient documentation

## 2012-06-08 DIAGNOSIS — I10 Essential (primary) hypertension: Secondary | ICD-10-CM | POA: Insufficient documentation

## 2012-06-08 DIAGNOSIS — E785 Hyperlipidemia, unspecified: Secondary | ICD-10-CM | POA: Insufficient documentation

## 2012-06-08 DIAGNOSIS — I252 Old myocardial infarction: Secondary | ICD-10-CM | POA: Insufficient documentation

## 2012-06-08 DIAGNOSIS — S20219A Contusion of unspecified front wall of thorax, initial encounter: Secondary | ICD-10-CM | POA: Insufficient documentation

## 2012-06-08 DIAGNOSIS — Y939 Activity, unspecified: Secondary | ICD-10-CM | POA: Insufficient documentation

## 2012-06-08 DIAGNOSIS — I2510 Atherosclerotic heart disease of native coronary artery without angina pectoris: Secondary | ICD-10-CM | POA: Insufficient documentation

## 2012-06-08 DIAGNOSIS — Z79899 Other long term (current) drug therapy: Secondary | ICD-10-CM | POA: Insufficient documentation

## 2012-06-08 DIAGNOSIS — R05 Cough: Secondary | ICD-10-CM | POA: Insufficient documentation

## 2012-06-08 DIAGNOSIS — S20211A Contusion of right front wall of thorax, initial encounter: Secondary | ICD-10-CM

## 2012-06-08 DIAGNOSIS — R059 Cough, unspecified: Secondary | ICD-10-CM | POA: Insufficient documentation

## 2012-06-08 DIAGNOSIS — T148XXA Other injury of unspecified body region, initial encounter: Secondary | ICD-10-CM | POA: Insufficient documentation

## 2012-06-08 DIAGNOSIS — F17200 Nicotine dependence, unspecified, uncomplicated: Secondary | ICD-10-CM | POA: Insufficient documentation

## 2012-06-08 LAB — BASIC METABOLIC PANEL
BUN: 24 mg/dL — ABNORMAL HIGH (ref 6–23)
Calcium: 9.4 mg/dL (ref 8.4–10.5)
Creatinine, Ser: 0.82 mg/dL (ref 0.50–1.10)
GFR calc Af Amer: 86 mL/min — ABNORMAL LOW (ref >90)
GFR calc non Af Amer: 74 mL/min — ABNORMAL LOW (ref >90)

## 2012-06-08 LAB — CBC
HCT: 39.2 % (ref 36.0–46.0)
MCH: 30 pg (ref 26.0–34.0)
MCHC: 33.9 g/dL (ref 30.0–36.0)
MCV: 88.5 fL (ref 78.0–100.0)
Platelets: 249 10*3/uL (ref 150–400)
RDW: 14.5 % (ref 11.5–15.5)
WBC: 7.5 10*3/uL (ref 4.0–10.5)

## 2012-06-08 LAB — PRO B NATRIURETIC PEPTIDE: Pro B Natriuretic peptide (BNP): 41 pg/mL (ref 0–125)

## 2012-06-08 MED ORDER — HYDROCODONE-ACETAMINOPHEN 5-325 MG PO TABS: 0.5000 | ORAL_TABLET | Freq: Four times a day (QID) | ORAL | Status: DC | PRN

## 2012-06-08 NOTE — ED Provider Notes (Addendum)
History     CSN: [id number]  Arrival date & time 06/08/12  1614   First MD Initiated Contact with Patient 06/08/12 1737      Chief Complaint  Patient presents with  . Chest Pain    (Consider location/radiation/quality/duration/timing/severity/associated sxs/prior treatment) Patient is a 67 y.o. female presenting with chest pain. The history is provided by the patient.  Chest Pain Pain location:  R chest Pain quality: radiating, sharp and stabbing   Pain radiates to:  Does not radiate Pain radiates to the back: no   Pain severity:  Severe Onset quality:  Sudden Duration:  2 weeks Timing:  Intermittent Progression:  Worsening Chronicity:  New Context: breathing, lifting, movement, raising an arm and trauma   Relieved by: some improvement with tylenol. Worsened by:  Coughing, deep breathing and exertion Associated symptoms: no abdominal pain, no back pain, no cough, no fever, no lower extremity edema, no nausea, no shortness of breath, not vomiting and no weakness   Risk factors: coronary artery disease     Past Medical History  Diagnosis Date  . Coronary artery disease 1966    Status post prior stenting at [facility] (unknown vessel);  Myoview 12/2010:  EF 59%, no ischemia, small anterior wall scar vs breast attenuation  . HTN (hypertension)   . Hyperlipidemia   . Tobacco abuse   . Arm numbness left     felt to be secondary to disk degeneration  . Myocardial infarction     Past Surgical History  Procedure Laterality Date  . Other surgical history       HYSTERECTOMY  . Coronary stent placement      Family History  Problem Relation Age of Onset  . Diabetes Mother   . Heart disease Father   . Heart attack Father   . Heart failure Father   . COPD Father     History  Substance Use Topics  . Smoking status: Current Every Day Smoker  . Smokeless tobacco: Not on file  . Alcohol Use: No    OB History   Grav Para Term Preterm Abortions TAB SAB Ect Mult  Living                  Review of Systems  Constitutional: Negative for fever.  Respiratory: Negative for cough and shortness of breath.   Cardiovascular: Positive for chest pain.  Gastrointestinal: Negative for nausea, vomiting and abdominal pain.  Musculoskeletal: Negative for back pain.  Neurological: Negative for weakness.  All other systems reviewed and are negative.    Allergies  Aspirin  Home Medications   Current Outpatient Rx  Name  Route  Sig  Dispense  Refill  . acetaminophen (TYLENOL) 500 MG tablet   Oral   Take 500 mg by mouth 3 (three) times daily as needed for pain. For pain         . carvedilol (COREG) 6.25 MG tablet   Oral   Take 1 tablet (6.25 mg total) by mouth 2 (two) times daily with a meal.   60 tablet   11   . simvastatin (ZOCOR) 40 MG tablet   Oral   Take 40 mg by mouth as needed.         . nitroGLYCERIN (NITROSTAT) 0.4 MG SL tablet   Sublingual   Place 0.4 mg under the tongue every 5 (five) minutes as needed.             BP 177/94  Pulse 92  Temp(Src) 97.8  F (36.6 C) (Oral)  Resp 16  SpO2 100%  Physical Exam  Nursing note and vitals reviewed. Constitutional: She is oriented to person, place, and time. She appears well-developed and well-nourished. No distress.  HENT:  Head: Normocephalic and atraumatic.  Mouth/Throat: Oropharynx is clear and moist.  Eyes: Conjunctivae and EOM are normal. Pupils are equal, round, and reactive to light.  Neck: Normal range of motion. Neck supple.  Cardiovascular: Normal rate, regular rhythm and intact distal pulses.   No murmur heard. Pulmonary/Chest: Effort normal and breath sounds normal. No respiratory distress. She has no wheezes. She has no rales. She exhibits tenderness. She exhibits no crepitus, no deformity and no swelling.    Abdominal: Soft. She exhibits no distension. There is no tenderness. There is no rebound and no guarding.  Musculoskeletal: Normal range of motion. She  exhibits no edema and no tenderness.  Neurological: She is alert and oriented to person, place, and time.  Skin: Skin is warm and dry. No rash noted. No erythema.  Psychiatric: She has a normal mood and affect. Her behavior is normal.    ED Course  Procedures (including critical care time)  Labs Reviewed  BASIC METABOLIC PANEL - Abnormal; Notable for the following:    Glucose, Bld 110 (*)    BUN 24 (*)    GFR calc non Af Amer 74 (*)    GFR calc Af Amer 86 (*)    All other components within normal limits  CBC  PRO B NATRIURETIC PEPTIDE  POCT I-STAT TROPONIN I   Dg Chest 2 View  06/08/2012  *RADIOLOGY REPORT*  Clinical Data: Larey Seat.  Chest pain.  CHEST - 2 VIEW  Comparison: 01/08/2011.  Findings: The cardiac silhouette, mediastinal and hilar contours are normal and stable.  The lungs are clear.  No pleural effusion, pleural thickening or pneumothorax.  The bony thorax is intact.  IMPRESSION: No acute cardiopulmonary findings and intact bony thorax.   Original Report Authenticated By: Rudie Meyer, M.D.      1. Chest wall contusion, right, initial encounter   2. Muscle strain       MDM   Patient with a mechanical fall 2 weeks ago with persistent right-sided chest wall pain that's worse with movement of her left arm and taking deep breaths. Because the pain was not improving she came for further evaluation. Patient had labs checked in triaged which were all within normal limits including a CBC, BMP and i-STAT. Chest x-ray showed no signs of pneumothorax or fracture however patient is very tender in the right sternal area through to the mid chest on the right around rib 7 or 8. Possibly occult fracture versus severe contusion or pectoralis injury. Patient given pain control and discharged home. Sats are 100% on room air and patient is in no acute distress        Gwyneth Sprout, MD 06/08/12 2956  Gwyneth Sprout, MD 06/08/12 2130

## 2012-06-08 NOTE — ED Notes (Signed)
Pt fell on 3/12 onto chest "knocking the wind out of me," then last Friday began to have chest pain increasingly worse since. Pt with bruising to R breast. Reports pain "Feels like a knife and it makes it hard to catch my breath." pain increased on movement and inspiration.

## 2012-06-16 ENCOUNTER — Ambulatory Visit
Admission: RE | Admit: 2012-06-16 | Discharge: 2012-06-16 | Disposition: A | Discharge status: Home / Self Care | Payer: PRIVATE HEALTH INSURANCE | Source: Ambulatory Visit | Attending: Family Medicine | Admitting: Family Medicine

## 2012-06-16 ENCOUNTER — Other Ambulatory Visit: Payer: Self-pay | Admitting: Family Medicine

## 2012-06-16 DIAGNOSIS — T148XXA Other injury of unspecified body region, initial encounter: Secondary | ICD-10-CM

## 2012-07-04 ENCOUNTER — Institutional Professional Consult (permissible substitution) (INDEPENDENT_AMBULATORY_CARE_PROVIDER_SITE_OTHER): Payer: PRIVATE HEALTH INSURANCE | Admitting: Cardiothoracic Surgery

## 2012-07-04 ENCOUNTER — Encounter: Payer: Self-pay | Admitting: Cardiothoracic Surgery

## 2012-07-04 VITALS — BP 171/104 | HR 84 | Resp 20 | Ht 60.0 in | Wt 155.0 lb

## 2012-07-04 DIAGNOSIS — R911 Solitary pulmonary nodule: Secondary | ICD-10-CM

## 2012-07-04 HISTORY — DX: Solitary pulmonary nodule: R91.1

## 2012-07-04 NOTE — Patient Instructions (Signed)
Smoking Cessation Quitting smoking is important to your health and has many advantages. However, it is not always easy to quit since nicotine is a very addictive drug. Often times, people try 3 times or more before being able to quit. This document explains the best ways for you to prepare to quit smoking. Quitting takes hard work and a lot of effort, but you can do it. ADVANTAGES OF QUITTING SMOKING  You will live longer, feel better, and live better.  Your body will feel the impact of quitting smoking almost immediately.  Within 20 minutes, blood pressure decreases. Your pulse returns to its normal level.  After 8 hours, carbon monoxide levels in the blood return to normal. Your oxygen level increases.  After 24 hours, the chance of having a heart attack starts to decrease. Your breath, hair, and body stop smelling like smoke.  After 48 hours, damaged nerve endings begin to recover. Your sense of taste and smell improve.  After 72 hours, the body is virtually free of nicotine. Your bronchial tubes relax and breathing becomes easier.  After 2 to 12 weeks, lungs can hold more air. Exercise becomes easier and circulation improves.  The risk of having a heart attack, stroke, cancer, or lung disease is greatly reduced.  After 1 year, the risk of coronary heart disease is cut in half.  After 5 years, the risk of stroke falls to the same as a nonsmoker.  After 10 years, the risk of lung cancer is cut in half and the risk of other cancers decreases significantly.  After 15 years, the risk of coronary heart disease drops, usually to the level of a nonsmoker.  If you are pregnant, quitting smoking will improve your chances of having a healthy baby.  The people you live with, especially any children, will be healthier.  You will have extra money to spend on things other than cigarettes. QUESTIONS TO THINK ABOUT BEFORE ATTEMPTING TO QUIT You may want to talk about your answers with your  caregiver.  Why do you want to quit?  If you tried to quit in the past, what helped and what did not?  What will be the most difficult situations for you after you quit? How will you plan to handle them?  Who can help you through the tough times? Your family? Friends? A caregiver?  What pleasures do you get from smoking? What ways can you still get pleasure if you quit? Here are some questions to ask your caregiver:  How can you help me to be successful at quitting?  What medicine do you think would be best for me and how should I take it?  What should I do if I need more help?  What is smoking withdrawal like? How can I get information on withdrawal? GET READY  Set a quit date.  Change your environment by getting rid of all cigarettes, ashtrays, matches, and lighters in your home, car, or work. Do not let people smoke in your home.  Review your past attempts to quit. Think about what worked and what did not. GET SUPPORT AND ENCOURAGEMENT You have a better chance of being successful if you have help. You can get support in many ways.  Tell your family, friends, and co-workers that you are going to quit and need their support. Ask them not to smoke around you.  Get individual, group, or telephone counseling and support. Programs are available at local hospitals and health centers. Call your local health department for   information about programs in your area.  Spiritual beliefs and practices may help some smokers quit.  Download a "quit meter" on your computer to keep track of quit statistics, such as how long you have gone without smoking, cigarettes not smoked, and money saved.  Get a self-help book about quitting smoking and staying off of tobacco. LEARN NEW SKILLS AND BEHAVIORS  Distract yourself from urges to smoke. Talk to someone, go for a walk, or occupy your time with a task.  Change your normal routine. Take a different route to work. Drink tea instead of coffee.  Eat breakfast in a different place.  Reduce your stress. Take a hot bath, exercise, or read a book.  Plan something enjoyable to do every day. Reward yourself for not smoking.  Explore interactive web-based programs that specialize in helping you quit. GET MEDICINE AND USE IT CORRECTLY Medicines can help you stop smoking and decrease the urge to smoke. Combining medicine with the above behavioral methods and support can greatly increase your chances of successfully quitting smoking.  Nicotine replacement therapy helps deliver nicotine to your body without the negative effects and risks of smoking. Nicotine replacement therapy includes nicotine gum, lozenges, inhalers, nasal sprays, and skin patches. Some may be available over-the-counter and others require a prescription.  Antidepressant medicine helps people abstain from smoking, but how this works is unknown. This medicine is available by prescription.  Nicotinic receptor partial agonist medicine simulates the effect of nicotine in your brain. This medicine is available by prescription. Ask your caregiver for advice about which medicines to use and how to use them based on your health history. Your caregiver will tell you what side effects to look out for if you choose to be on a medicine or therapy. Carefully read the information on the package. Do not use any other product containing nicotine while using a nicotine replacement product.  RELAPSE OR DIFFICULT SITUATIONS Most relapses occur within the first 3 months after quitting. Do not be discouraged if you start smoking again. Remember, most people try several times before finally quitting. You may have symptoms of withdrawal because your body is used to nicotine. You may crave cigarettes, be irritable, feel very hungry, cough often, get headaches, or have difficulty concentrating. The withdrawal symptoms are only temporary. They are strongest when you first quit, but they will go away within  10 14 days. To reduce the chances of relapse, try to:  Avoid drinking alcohol. Drinking lowers your chances of successfully quitting.  Reduce the amount of caffeine you consume. Once you quit smoking, the amount of caffeine in your body increases and can give you symptoms, such as a rapid heartbeat, sweating, and anxiety.  Avoid smokers because they can make you want to smoke.  Do not let weight gain distract you. Many smokers will gain weight when they quit, usually less than 10 pounds. Eat a healthy diet and stay active. You can always lose the weight gained after you quit.  Find ways to improve your mood other than smoking. FOR MORE INFORMATION  www.smokefree.gov  Document Released: 02/23/2001 Document Revised: 08/31/2011 Document Reviewed: 06/10/2011 [facility] Patient Information 2013 [place], [place].  Smoking and Your Digestive System Cigarette smoking causes many life-threatening diseases. These include lung cancer, other cancers, emphysema, and heart disease. About 430,000 deaths each year are directly caused by cigarette smoking. Smoking results in disease-causing changes in all parts of the body. This includes the digestive system. This can cause serious effects, since the digestive system  converts foods into nutrients the body needs to live. Smoking has been shown to have harmful effects on all parts of the digestive system. It adds to common disorders, such as heartburn and peptic ulcers. It also increases the risk of Crohn's disease, and possibly gallstones. Smoking seems to affect the liver by changing the way it handles drugs and alcohol and removes them. In fact, there seems to be enough evidence to stop smoking based solely on digestive distress. Some of the harmful effects of smoking are:  Heartburn (acid reflux).  Heartburn happens when acidic juices from the stomach splash into the esophagus, which has a more sensitive and less acid-resistant lining than the stomach. Normally,  a muscular valve at the lower end of the esophagus keeps out the acid solution in the stomach. Smoking decreases the strength of the esophageal valve and its ability to keep out acidic stomach contents. This allows stomach acid reflux, or flow backward into the esophagus.  Smoking also seems to promote the movement of bile salts from the intestine to the stomach. This makes stomach acids more harmful.  A peptic ulcer is an open sore in the lining of the stomach or duodenum (first part of the small intestine). The exact cause of ulcers is not known. A link between smoking cigarettes and ulcers, especially duodenal ulcers, does exist. Ulcers are more likely to occur, less likely to heal, and more likely to cause death in smokers than in nonsmokers.  Some research suggests that smoking might increase a person's risk of infection with the bacterium Helicobacter pylori (H. pylori). Most peptic ulcers are caused by this bacterium.  Stomach acid is also important in causing ulcers. Normally, most of this acid is buffered (neutralized) by the food we eat. Most of the unbuffered acid that enters the duodenum is quickly neutralized by sodium bicarbonate. This is a naturally occurring alkali, produced by the pancreas. Some studies show that smoking reduces the bicarbonate produced by the pancreas. This interferes with the neutralization of acid in the duodenum. Other studies suggest that chronic cigarette smoking may increase the amount of acid produced by the stomach.  Whatever causes the link between smoking and ulcers, two points have been repeatedly shown. People who smoke are more likely to develop an ulcer, especially a duodenal ulcer. Ulcers in smokers are less likely to heal quickly in response to otherwise effective treatment. This research strongly suggests that a person with an ulcer should stop smoking.  The liver is an important organ with many tasks. One task of the liver is to prepare drugs, alcohol,  and other toxins for elimination (removal) from the body. There is evidence that smoking alters the ability of the liver to effectively handle such substances. In some cases, this may influence the dose of medicine needed to treat an illness. Some research suggests that smoking can aggravate and speed up the course of liver disease caused by excessive alcohol intake.  Studies have shown that smokers have weaker or less frequent stomach contractions while smoking, which can cause less efficient digestion.  Crohn's disease causes inflammation deep in the lining of the intestine. The disease causes pain and diarrhea. It usually affects the small intestine, but it can occur anywhere in the digestive tract. Research shows that current and former smokers have a higher risk of developing Crohn's disease than nonsmokers. Among people with the disease, smoking is linked with a higher rate of relapse, repeat surgery, and immunosuppressive treatment. In all areas, the risk for women  who are current or former smokers is slightly higher than for men. Why smoking increases the risk of Crohn's disease is unknown.  Several studies suggest that smoking may increase the risk of developing gallstones. The risk may be higher for women. Research results on this topic are not consistent. More studies are needed.  Oral (lip and mouth) cancer and cancer of the pharynx (throat) and the esophagus are caused by smoking. Smoking may be associated with pancreatic cancer.  Some of the effects of smoking on the digestive system seem to be short-lived. For example, the effect of smoking on bicarbonate production by the pancreas does not appear to last. Half an hour after smoking, the production of bicarbonate returns to normal. The effects of smoking on how the liver handles drugs also disappear when a person stops smoking. However, people who no longer smoke still remain at risk for Crohn's disease. Document Released: 02/12/2004  Document Revised: 05/24/2011 Document Reviewed: 12/16/2008 [facility] Patient Information 2013 [place], [place].  Smoking Cessation, Tips for Success YOU CAN QUIT SMOKING If you are ready to quit smoking, congratulations! You have chosen to help yourself be healthier. Cigarettes bring nicotine, tar, carbon monoxide, and other irritants into your body. Your lungs, heart, and blood vessels will be able to work better without these poisons. There are many different ways to quit smoking. Nicotine gum, nicotine patches, a nicotine inhaler, or nicotine nasal spray can help with physical craving. Hypnosis, support groups, and medicines help break the habit of smoking. Here are some tips to help you quit for good.  Throw away all cigarettes.  Clean and remove all ashtrays from your home, work, and car.  On a card, write down your reasons for quitting. Carry the card with you and read it when you get the urge to smoke.  Cleanse your body of nicotine. Drink enough water and fluids to keep your urine clear or pale yellow. Do this after quitting to flush the nicotine from your body.  Learn to predict your moods. Do not let a bad situation be your excuse to have a cigarette. Some situations in your life might tempt you into wanting a cigarette.  Never have "just one" cigarette. It leads to wanting another and another. Remind yourself of your decision to quit.  Change habits associated with smoking. If you smoked while driving or when feeling stressed, try other activities to replace smoking. Stand up when drinking your coffee. Brush your teeth after eating. Sit in a different chair when you read the paper. Avoid alcohol while trying to quit, and try to drink fewer caffeinated beverages. Alcohol and caffeine may urge you to smoke.  Avoid foods and drinks that can trigger a desire to smoke, such as sugary or spicy foods and alcohol.  Ask people who smoke not to smoke around you.  Have something planned to do  right after eating or having a cup of coffee. Take a walk or exercise to perk you up. This will help to keep you from overeating.  Try a relaxation exercise to calm you down and decrease your stress. Remember, you may be tense and nervous for the first 2 weeks after you quit, but this will pass.  Find new activities to keep your hands busy. Play with a pen, coin, or rubber band. Doodle or draw things on paper.  Brush your teeth right after eating. This will help cut down on the craving for the taste of tobacco after meals. You can try mouthwash, too.  Use oral substitutes, such as lemon drops, carrots, a cinnamon stick, or chewing gum, in place of cigarettes. Keep them handy so they are available when you have the urge to smoke.  When you have the urge to smoke, try deep breathing.  Designate your home as a nonsmoking area.  If you are a heavy smoker, ask your caregiver about a prescription for nicotine chewing gum. It can ease your withdrawal from nicotine.  Reward yourself. Set aside the cigarette money you save and buy yourself something nice.  Look for support from others. Join a support group or smoking cessation program. Ask someone at home or at work to help you with your plan to quit smoking.  Always ask yourself, "Do I need this cigarette or is this just a reflex?" Tell yourself, "Today, I choose not to smoke," or "I do not want to smoke." You are reminding yourself of your decision to quit, even if you do smoke a cigarette. HOW WILL I FEEL WHEN I QUIT SMOKING?  The benefits of not smoking start within days of quitting.  You may have symptoms of withdrawal because your body is used to nicotine (the addictive substance in cigarettes). You may crave cigarettes, be irritable, feel very hungry, cough often, get headaches, or have difficulty concentrating.  The withdrawal symptoms are only temporary. They are strongest when you first quit but will go away within 10 to 14 days.  When  withdrawal symptoms occur, stay in control. Think about your reasons for quitting. Remind yourself that these are signs that your body is healing and getting used to being without cigarettes.  Remember that withdrawal symptoms are easier to treat than the major diseases that smoking can cause.  Even after the withdrawal is over, expect periodic urges to smoke. However, these cravings are generally short-lived and will go away whether you smoke or not. Do not smoke!  If you relapse and smoke again, do not lose hope. Most smokers quit 3 times before they are successful.  If you relapse, do not give up! Plan ahead and think about what you will do the next time you get the urge to smoke. LIFE AS A NONSMOKER: MAKE IT FOR A MONTH, MAKE IT FOR LIFE Day 1: Hang this page where you will see it every day. Day 2: Get rid of all ashtrays, matches, and lighters. Day 3: Drink water. Breathe deeply between sips. Day 4: Avoid places with smoke-filled air, such as bars, clubs, or the smoking section of restaurants. Day 5: Keep track of how much money you save by not smoking. Day 6: Avoid boredom. Keep a good book with you or go to the movies. Day 7: Reward yourself! One week without smoking! Day 8: Make a dental appointment to get your teeth cleaned. Day 9: Decide how you will turn down a cigarette before it is offered to you. Day 10: Review your reasons for quitting. Day 11: Distract yourself. Stay active to keep your mind off smoking and to relieve tension. Take a walk, exercise, read a book, do a crossword puzzle, or try a new hobby. Day 12: Exercise. Get off the bus before your stop or use stairs instead of escalators. Day 13: Call on friends for support and encouragement. Day 14: Reward yourself! Two weeks without smoking! Day 15: Practice deep breathing exercises. Day 16: Bet a friend that you can stay a nonsmoker. Day 17: Ask to sit in nonsmoking sections of restaurants. Day 18: Hang up "No Smoking"  signs.  Day 19: Think of yourself as a nonsmoker. Day 20: Each morning, tell yourself you will not smoke. Day 21: Reward yourself! Three weeks without smoking! Day 22: Think of smoking in negative ways. Remember how it stains your teeth, gives you bad breath, and leaves you short of breath. Day 23: Eat a nutritious breakfast. Day 24:Do not relive your days as a smoker. Day 25: Hold a pencil in your hand when talking on the telephone. Day 26: Tell all your friends you do not smoke. Day 27: Think about how much better food tastes. Day 28: Remember, one cigarette is one too many. Day 29: Take up a hobby that will keep your hands busy. Day 30: Congratulations! One month without smoking! Give yourself a big reward. Your caregiver can direct you to community resources or hospitals for support, which may include:  Group support.  Education.  Hypnosis.  Subliminal therapy. Document Released: 11/28/2003 Document Revised: 05/24/2011 Document Reviewed: 12/16/2008 [facility] Patient Information 2013 [place], [place].  Pulmonary Nodule A pulmonary (lung) nodule is small, round growth in the lung. The size of a pulmonary nodule can be as small as a pencil eraser (1/5 inch or 4 millmeters) to a little bigger than your biggest toenail (1 inch or 25 millimeters). A pulmonary nodule is usually an unplanned finding. It may be found on a chest X-ray or a computed tomography (CT) scan when you have imaging tests of your lungs done. When a pulmonary nodule is found, tests will be done to determine if the nodule is benign (not cancerous) or malignant (cancerous). Follow-up treatment or testing is based on the size of the pulmonary nodule and your risk of getting lung cancer.  CAUSES Causes of pulmonary nodules can vary.  Benign pulmonary nodules  can be caused from different things. Some of these things include:  Infection. This can be a common cause of a benign pulmonary nodule. The infection may be active (a  current infection) or an old infection that is no longer active. Three types of infections can cause a pulmonary nodule. These are:  Bacterial Infection.  Fungal infection.  Viral Infections.  Hematoma. This is a bruise in the lung. A hematoma can happen from an injury to your chest.  Some common diseases can lead to benign pulmonary nodules. For example, rheumatoid arthritis can be a cause of a pulmonary nodule.  Other unusual things can cause a benign pulmonary nodule. These can include:  Having had tuberculosis.  Rare diseases, such as a lung cyst. Malignant pulmonary nodules.  These are cancerous growths. The cancer may have:  Started in the lung. Some lung cancers first detected as a pulmonary nodule.  Spread to the lung from cancer somewhere else in the body. This is called metastatic cancer.  Certain risk factors make a cancerous pulmonary nodule more likely. They include:  Age. As people get older, a pulmonary nodule is more likely to be cancerous.  Cancer history. If one of your immediate family members has had cancer, you have a higher risk of developing cancer.  Smoking. This includes people who currently smoke and those who have quit. DIAGNOSIS To diagnose whether a pulmonary nodule is benign or malignant, a variety of tests will be done. This includes things such as:  Health history. Questions regarding your current health, past health, and family health will be asked.  Blood tests. Results of blood work can show:  Tumor markers for cancer.  Any type of infection.  A skin test called a tuberculin (  TB) test may be done. This test can tell if you have been exposed to the germ that causes tuberculosis.  Imaging tests. These take pictures of your lungs. Types of imaging tests include:  Chest X-ray. This can help in several ways. An X-ray gives a close-up look at the pulmonary nodule. A new X-ray can be compared with any X-rays you have had in the past.    Computed tomography  (CT) scan. This test shows smaller pulmonary nodules more clearly than an X-ray.  Positron emission tomography  (PET) scan. This is a test that uses a radioactive substance to identify a pulmonary nodule. A safe amount of radioactive substance is injected into the blood stream. Then, the scan takes a picture of the pulmonary nodule. A malignant pulmonary nodule will absorb the substance faster than a benign pulmonary nodule. The radioactive substance is eliminated from your body in your urine.  Biopsy.  This removes a tiny piece of the pulmonary nodule so it can be checked under a microscope. Medicine will be given to help keep you relaxed and pain free when a biopsy is done. Types of biopsies include:  Bronchoscopy . This is a surgical procedure. It can be used for pulmonary nodules that are close to the airways in the lung. It uses a scope (a thin tube) with a tiny camera and light on the end. The scope is put in the windpipe. Your caregiver can then see inside the lung. A tiny tool put through the scope is used to take a small sample of the pulmonary nodule tissue.  Transthoracic needle aspiration . This method is used if the pulmonary nodule is far away from the air passages in the lung. A long, thin needle is put through the chest into the lung nodule. A CT scan is done at the same time which can make it easier to locate the pulmonary nodule.  Surgical lung biopsy . This is a surgical procedure in which the pulmonary nodule is removed. This is usually recommended when the pulmonary nodule is most likely malignant or a biopsy cannot be obtained by either bronchoscopy or transthoracic needle aspiration. PULMONARY NODULE FOLLOW-UP RECOMMENDATIONS The frequency of pulmonary nodule follow-up is based on your risk factors and size of the pulmonary nodule. If your caregiver suspects the pulmonary nodule is cancerous or the pulmonary nodule changes during any of the follow-up CT  scans, additional testing or biopsies will be done.   If you have no or low risk of getting lung cancer (non-smoker, no personal cancer history), recommended follow-up is based on the following pulmonary nodule size:  A pulmonary nodule that is < 4 mm does not require any follow-up.  A pulmonary nodule that is 4 to 6 mm should be re-imaged by CT scan in 12 months.  A pulmonary nodule that is 6 to 8 mm should be re-imaged by CT scan at 6 to 12 months and then again at 18 to 24 months if no change in size.  A pulmonary nodule > 8 mm in size should be followed closely and re-imaged by CT scan at 3, 9, and 24 months.   If you are at risk of getting lung cancer (current or former smoker, family history of cancer), recommended follow-up is based on the following pulmonary nodule size:  A pulmonary nodule that is < 4 mm in size should be re-imaged by CT scan in 12 months.  A pulmonary nodule that is 4 to 6 mm in size should be  re-imaged by CT scan at 6 to 12 months and again at 18 to 24 months.  A pulmonary nodule that is 6 to 8 mm in size should be re-imaged by CT scan at 3, 9, and 24 months.  A pulmonary nodule > 8 mm in size should be followed closely and re-imaged by CT scan at 3, 9, and 24 months. SEEK MEDICAL CARE IF: While waiting for test results to determine what type of pulmonary nodule you have, be sure to contact your caregiver if you:  Have trouble breathing when you are active.  Feel sick or unusually tired.  Do not feel like eating.  Lose weight without trying to.  Develop chills or night sweats.  Mild or moderate fevers generally have no long-term effects and often do not require treatment. There are a few exceptions (see below). SEEK IMMEDIATE MEDICAL CARE IF:  You cannot catch your breath or you begin wheezing.  You cannot stop coughing.  You cough up blood.  You feel like you are going to pass out or become dizzy.  You have sudden chest pain.  You have a  fever or persistent symptoms for more than 72 hours.  You have a fever and your symptoms suddenly get worse. MAKE SURE YOU   Understand these instructions.  Will watch your condition.  Will get help right away if you are not doing well or get worse. Document Released: 12/27/2008 Document Revised: 05/24/2011 Document Reviewed: 12/27/2008 [facility] Patient Information 2013 [place], [place].

## 2012-07-04 NOTE — Progress Notes (Addendum)
[address]            [place] [address]          [phone]      [name] Medical Record #[id number] Date of Birth: 1945-10-25  Referring: Henrine Screws, MD Primary Care: same  Chief Complaint:    Chief Complaint  Patient presents with  . Lung Lesion    surgical eval on right upper lobe  4mm nodule    History of Present Illness:    Patient is a 67 year old female with a 20-25 year history of smoking 5-6 cigarettes a day who is referred for the incidental finding of a 4 mm lung nodule in the right upper lobe. The patient notes that she fell in a store on 05/24/2012 when her pants leg got caught on a pallet and she fell on the floor. She notes about 6 days after she fell she had increasing anterior chest wall pain and shortness of breath and [facility]. She was evaluated that time and told she had not had a myocardial infarction but she does have a history of coronary artery disease. A CT scan of the chest down the incidental 4 mm right upper lobe lung nodule without other evidence of lung cancer. The radiographic recommendation was for a CT scan of the chest in one year. The patient wished to see a thoracic surgeon before she went along with this recommendation.  She comes to the office today hypertensive having not taken her blood pressure medicine. She has no pulmonary complaints.      Current Activity/ Functional Status:  Patient is independent with mobility/ambulation, transfers, ADL's, IADL's.  Zubrod Score: At the time of surgery this patient's most appropriate activity status/level should be described as: [x]  Normal activity, no symptoms []  Symptoms, fully ambulatory []  Symptoms, in bed less than or equal to 50% of the time []  Symptoms, in bed greater than 50% of the time but less than 100% []  Bedridden []  Moribund   Past Medical History  Diagnosis Date  . Coronary artery disease 1966      Status post prior stenting at [facility] (unknown vessel);  Myoview 12/2010:  EF 59%, no ischemia, small anterior wall scar vs breast attenuation  . HTN (hypertension)   . Hyperlipidemia   . Tobacco abuse   . Arm numbness left     felt to be secondary to disk degeneration  . Myocardial infarction     Past Surgical History  Procedure Laterality Date  . Other surgical history       HYSTERECTOMY  . Coronary stent placement      Family History  Problem Relation Age of Onset  . Diabetes Mother   . Heart disease Father   . Heart attack Father   . Heart failure Father   . COPD Father     History   Social History  . Marital Status: Legally Separated    Spouse Name: N/A    Number of Children: N/A  . Years of Education: N/A   Occupational History  . Not on file.   Social History Main Topics  . Smoking status: Current Every Day Smoker  . Smokeless tobacco: Not on file  . Alcohol Use: No  . Drug Use: No        Social History Narrative  . No narrative  on file    History  Smoking status  . Current Every Day Smoker  Smokeless tobacco  . Not on file    History  Alcohol Use No     Allergies  Allergen Reactions  . Aspirin     GI Upset    Current Outpatient Prescriptions  Medication Sig Dispense Refill  . acetaminophen (TYLENOL) 500 MG tablet Take 500 mg by mouth 3 (three) times daily as needed for pain. For pain      . carvedilol (COREG) 6.25 MG tablet Take 1 tablet (6.25 mg total) by mouth 2 (two) times daily with a meal.  60 tablet  11  . HYDROcodone-acetaminophen (NORCO/VICODIN) 5-325 MG per tablet Take 0.5-1 tablets by mouth every 6 (six) hours as needed for pain.  10 tablet  0  . nitroGLYCERIN (NITROSTAT) 0.4 MG SL tablet Place 0.4 mg under the tongue every 5 (five) minutes as needed.        . simvastatin (ZOCOR) 40 MG tablet Take 40 mg by mouth as needed.       No current facility-administered medications for this visit.       Review of  Systems:     Cardiac Review of Systems: Y or N  Chest Pain [  n  ]  Resting SOB [  n ] Exertional SOB  [ n ]  Orthopnea [ n ]   Pedal Edema [ n  ]    Palpitations [ n ] Syncope  [ n ]   Presyncope [  n ]  General Review of Systems: [Y] = yes [  ]=no Constitional: recent weight change [ n ]; anorexia [  ]; fatigue [  ]; nausea [  ]; night sweats [  ]; fever [  ]; or chills [  ];                                                                                                                                          Dental: poor dentition[no teeth  ]; Last Dentist visit:   Eye : blurred vision [  ]; diplopia [   ]; vision changes [  ];  Amaurosis fugax[  ]; Resp: cough [  ];  wheezing[n  ];  hemoptysis[ n ]; shortness of breath[ n ]; paroxysmal nocturnal dyspnea[ n ]; dyspnea on exertion[  ]; or orthopnea[  ];  GI:  gallstones[ on ct scan ], vomiting[  ];  dysphagia[  ]; melena[  ];  hematochezia [  ]; heartburn[  ];   Hx of  Colonoscopy[  ]; GU: kidney stones [  ]; hematuria[  ];   dysuria [  ];  nocturia[  ];  history of     obstruction [  ]; urinary frequency [  ]             Skin: rash, swelling[  ];, hair loss[  ];  peripheral edema[  ];  or itching[  ]; Musculosketetal: myalgias[  ];  joint swelling[  ];  joint erythema[  ];  joint pain[  ];  back pain[  ];  Heme/Lymph: bruising[  ];  bleeding[  ];  anemia[  ];  Neuro: TIA[  ];  headaches[  ];  stroke[  ];  vertigo[  ];  seizures[  ];   paresthesias[  ];  difficulty walking[ n ];  Psych:depression[  ]; anxiety[  ];  Endocrine: diabetes[  ];  thyroid dysfunction[  ];  Immunizations: Flu Milo.Brash  ]; Pneumococcal[n  ];  Other:  Physical Exam: BP 171/104  Pulse 84  Resp 20  Ht 5' (1.524 m)  Wt 155 lb (70.308 kg)  BMI 30.27 kg/m2  SpO2 98%  General appearance: alert, cooperative, appears stated age and no distress Neurologic: intact Heart: regular rate and rhythm, S1, S2 normal, no murmur, click, rub or gallop and normal apical  impulse Lungs: clear to auscultation bilaterally and normal percussion bilaterally Abdomen: soft, non-tender; bowel sounds normal; no masses,  no organomegaly Extremities: extremities normal, atraumatic, no cyanosis or edema and Homans sign is negative, no sign of DVT Patient has known carotid disease, I do not appreciate any carotid bruits  Diagnostic Studies & Laboratory data:     Recent Radiology Findings:  Dg Chest 2 View  06/08/2012  *RADIOLOGY REPORT*  Clinical Data: Larey Seat.  Chest pain.  CHEST - 2 VIEW  Comparison: 01/08/2011.  Findings: The cardiac silhouette, mediastinal and hilar contours are normal and stable.  The lungs are clear.  No pleural effusion, pleural thickening or pneumothorax.  The bony thorax is intact.  IMPRESSION: No acute cardiopulmonary findings and intact bony thorax.   Original Report Authenticated By: Rudie Meyer, M.D.    Ct Chest Wo Contrast  06/16/2012  *RADIOLOGY REPORT*  Clinical Data: Contusion to sternum and ribs  CT CHEST WITHOUT CONTRAST  Technique:  Multidetector CT imaging of the chest was performed following the standard protocol without IV contrast.  Comparison: None  Findings:  Lungs/pleura: There is no pleural effusion.  There is no airspace consolidation or atelectasis identified.  No evidence for pulmonary contusion.  Right upper lobe nodule measures 4 mm, image 9/series 3.  Heart/Mediastinum: The heart size is normal.  No pericardial effusion.  Calcification within the LAD coronary artery noted. There is no enlarged mediastinal or hilar lymph nodes identified. Left-sided superior vena cava is noted.  Upper abdomen: There is a 3 mm stone within the upper pole of the left kidney, image 53/series 2.  Bones/Musculoskeletal:  No supraclavicular or axillary adenopathy. The sternum appears intact.  IMPRESSION:  1.  No acute findings. 2.  Pulmonary nodule in the right upper lobe measures 4 mm. If the patient is at high risk for bronchogenic carcinoma, follow-up  chest CT at 1 year is recommended.  If the patient is at low risk, no follow-up is needed.  This recommendation follows the consensus statement: Guidelines for Management of Small Pulmonary Nodules Detected on CT Scans:  A Statement from the Fleischner Society as published in Radiology 2005; 237:395-400. 3.  Coronary artery calcifications.   Original Report Authenticated By: Signa Kell, M.D.     Recent Lab Findings: Lab Results  Component Value Date   WBC 7.5 06/08/2012   HGB 13.3 06/08/2012   HCT 39.2 06/08/2012   PLT 249 06/08/2012   GLUCOSE 110* 06/08/2012   CHOL 257* 06/01/2011   TRIG 180.0* 06/01/2011   HDL 39.40  06/01/2011   LDLDIRECT 168.3 06/01/2011   LDLCALC 167* 01/09/2011   ALT 11 04/05/2011   AST 15 04/05/2011   NA 138 06/08/2012   K 4.3 06/08/2012   CL 102 06/08/2012   CREATININE 0.82 06/08/2012   BUN 24* 06/08/2012   CO2 25 06/08/2012   TSH 0.828 01/08/2011   INR 0.92 01/08/2011   HGBA1C 6.2* 01/08/2011   10 minute of the office visit was spent discussing with the patient strategies to stop smoking, and to help me to do so.    Assessment / Plan:   Pulmonary Nodule in the right upper lobe measures 4 mm- repeat CT scan in one year is recommended History of tobacco use, patient has been referred to the outpatient smoking cessation class Known CAD with stent place at [facility], coronary calcification on CT of Chest Hypertension. - on  Coreg to 6.25 mg twice daily and  lisinopril 20 mg daily. Blood pressure is borderline today- I discussed with the patient the need for good blood pressure control and the importance of taking her blood pressure medicine as prescribed she noted she is currently run out of it and did not take it this morning. Dyslipidemia. On Zocor but is unclear she's actually taking History of Carotid stenosis. The patient had 40-59% bilateral ICA stenosis asymptomatic   Delight Ovens MD  Beeper [phone] Office [phone] 07/04/2012 2:09 PM

## 2012-07-06 ENCOUNTER — Telehealth: Payer: Self-pay | Admitting: Emergency Medicine

## 2012-07-06 MED ORDER — LISINOPRIL 20 MG PO TABS: 20.0000 mg | ORAL_TABLET | Freq: Every day | ORAL | Status: DC

## 2012-07-06 NOTE — Telephone Encounter (Signed)
refill 

## 2012-07-20 ENCOUNTER — Encounter: Payer: Self-pay | Admitting: Family Medicine

## 2012-09-13 ENCOUNTER — Ambulatory Visit: Payer: PRIVATE HEALTH INSURANCE | Admitting: Cardiovascular Disease

## 2013-04-18 ENCOUNTER — Encounter: Payer: Self-pay | Admitting: Physician Assistant

## 2013-04-18 ENCOUNTER — Ambulatory Visit (INDEPENDENT_AMBULATORY_CARE_PROVIDER_SITE_OTHER): Payer: Medicare Other | Admitting: Physician Assistant

## 2013-04-18 ENCOUNTER — Other Ambulatory Visit: Payer: Self-pay | Admitting: *Deleted

## 2013-04-18 ENCOUNTER — Encounter (INDEPENDENT_AMBULATORY_CARE_PROVIDER_SITE_OTHER): Payer: Self-pay

## 2013-04-18 VITALS — BP 150/84 | HR 74 | Wt 151.0 lb

## 2013-04-18 DIAGNOSIS — I6529 Occlusion and stenosis of unspecified carotid artery: Secondary | ICD-10-CM | POA: Diagnosis not present

## 2013-04-18 DIAGNOSIS — I10 Essential (primary) hypertension: Secondary | ICD-10-CM

## 2013-04-18 MED ORDER — SIMVASTATIN 40 MG PO TABS: 40.0000 mg | ORAL_TABLET | Freq: Every day | ORAL | Status: DC

## 2013-04-18 MED ORDER — LISINOPRIL 20 MG PO TABS: 20.0000 mg | ORAL_TABLET | Freq: Every day | ORAL | Status: DC

## 2013-04-18 MED ORDER — CARVEDILOL 6.25 MG PO TABS: 6.2500 mg | ORAL_TABLET | Freq: Two times a day (BID) | ORAL | Status: DC

## 2013-04-18 MED ORDER — NITROGLYCERIN 0.4 MG SL SUBL: 0.4000 mg | SUBLINGUAL_TABLET | SUBLINGUAL | Status: DC | PRN

## 2013-04-18 NOTE — Assessment & Plan Note (Signed)
Discussed smoking cessation. Patient unwilling to quit.

## 2013-04-18 NOTE — Assessment & Plan Note (Signed)
Patient has bilateral carotid bruits and known stenosis. We'll recheck carotid Dopplers

## 2013-04-18 NOTE — Addendum Note (Signed)
Addended by: Burnett Kanaris on: 04/18/2013 11:06 AM   Modules accepted: Orders

## 2013-04-18 NOTE — Assessment & Plan Note (Signed)
Blood pressure up today but she claims it's stable at home. She did not take her medication before she came. Recommend 2 g sodium diet. Refill medications.

## 2013-04-18 NOTE — Progress Notes (Signed)
HPI:  This is a 68 year old female patient of Dr. Burt Knack who has history of coronary artery disease status post stenting in 1996. She was hospitalized for chest pain 2012 at which time Myoview was negative for ischemia EF was 59%. She also has hypertension, diabetes, and tobacco abuse. She also has a history of 40-59% bilateral ICA stenosis on carotid Dopplers in 02/24/11.  The patient comes in today for yearly followup. She did not take her blood pressure medicine today and her blood pressure is up. She says her blood pressure is usually 132/67-87. She continues to smoke about 8 cigarettes a day and doesn't seem willing to quit. She also enjoys eating salt. She stopped taking her Zocor because she said she ran out and couldn't afford it. She now has new insurance and is ready to resume it.   She denies any chest pain, palpitations, dyspnea, dyspnea on exertion, dizziness, or presyncope.   Allergies -- Aspirin    --  GI Upset  Current Outpatient Prescriptions on File Prior to Visit: acetaminophen (TYLENOL) 500 MG tablet, Take 500 mg by mouth 3 (three) times daily as needed for pain. For pain, Disp: , Rfl:  carvedilol (COREG) 6.25 MG tablet, Take 1 tablet (6.25 mg total) by mouth 2 (two) times daily with a meal., Disp: 60 tablet, Rfl: 11 HYDROcodone-acetaminophen (NORCO/VICODIN) 5-325 MG per tablet, Take 0.5-1 tablets by mouth every 6 (six) hours as needed for pain., Disp: 10 tablet, Rfl: 0 lisinopril (PRINIVIL,ZESTRIL) 20 MG tablet, Take 1 tablet (20 mg total) by mouth daily., Disp: 90 tablet, Rfl: 0 nitroGLYCERIN (NITROSTAT) 0.4 MG SL tablet, Place 0.4 mg under the tongue every 5 (five) minutes as needed.  , Disp: , Rfl:  simvastatin (ZOCOR) 40 MG tablet, Take 40 mg by mouth as needed., Disp: , Rfl:   No current facility-administered medications on file prior to visit.   Past Medical History:   Coronary artery disease                         1966           Comment:Status post prior stenting  at [facility]               (unknown vessel);  Myoview 12/2010:  EF 59%, no              ischemia, small anterior wall scar vs breast               attenuation   HTN (hypertension)                                           Hyperlipidemia                                               Tobacco abuse                                                Arm numbness left  Comment:felt to be secondary to disk degeneration   Myocardial infarction                                       Past Surgical History:   OTHER SURGICAL HISTORY                                          Comment: HYSTERECTOMY   CORONARY STENT PLACEMENT                                      ABDOMINAL HYSTERECTOMY                                        HIATAL HERNIA REPAIR                                         Review of patient's family history indicates:   Diabetes                       Mother                   Heart disease                  Father                   Heart attack                   Father                   Heart failure                  Father                   COPD                           Father                   Social History   Marital Status: Legally Separated   Spouse Name:                      Years of Education:                 Number of children:             Occupational History   None on file  Social History Main Topics   Smoking Status: Current Every Day Smoker        Packs/Day: 0.00  Years:         Smokeless Status: Not on file                      Alcohol Use: No             Drug Use: No             Sexual  Activity: Not on file        Other Topics            Concern   None on file  Social History Narrative   None on file    ROS: Patient claims she vomits after eating less she smokes a cigarette. Hasn't seen a gastroenterologist in many years. See history of present illness otherwise negative   PHYSICAL EXAM: Well-nournished, in no acute  distress. Neck: Bilateral carotid bruits right greater than No JVD, HJR, or thyroid enlargement  Lungs: Decreased breath sounds throughout No tachypnea, clear without wheezing, rales, or rhonchi  Cardiovascular: RRR, PMI not displaced, positive S4 2/6 systolic murmur at the left sternal border, no bruit, thrill, or heave.  Abdomen: BS normal. Soft without organomegaly, masses, lesions or tenderness.  Extremities: without cyanosis, clubbing or edema. Good distal pulses bilateral  SKin: Warm, no lesions or rashes   Musculoskeletal: No deformities  Neuro: no focal signs  BP 150/84  Pulse 74  Wt 151 lb (68.493 kg)    EKG: Normal sinus rhythm normal EKG

## 2013-04-18 NOTE — Assessment & Plan Note (Signed)
Stable without chest pain

## 2013-04-18 NOTE — Addendum Note (Signed)
Addended by: Burnett Kanaris on: 04/18/2013 11:15 AM   Modules accepted: Orders

## 2013-04-18 NOTE — Assessment & Plan Note (Signed)
Patient stopped taking her Zocor because she ran out of it and it was expensive. She is willing to resume because she has no insurance. Check fasting lipid panel and LFTs in 6 weeks.

## 2013-04-18 NOTE — Patient Instructions (Addendum)
Your physician has requested that you have a carotid duplex. This test is an ultrasound of the carotid arteries in your neck. It looks at blood flow through these arteries that supply the brain with blood. Allow one hour for this exam. There are no restrictions or special instructions.  Your physician recommends that you return for lab work in: 6 weeks: Cholesterol, LFT  Resume Zocor 40 mg daily  Your physician wants you to follow-up in: 6 months with Dr. Emelda Fear will receive a reminder letter in the mail two months in advance. If you don't receive a letter, please call our office to schedule the follow-up appointment.  2 Gram Low Sodium Diet A 2 gram sodium diet restricts the amount of sodium in the diet to no more than 2 g or 2000 mg daily. Limiting the amount of sodium is often used to help lower blood pressure. It is important if you have heart, liver, or kidney problems. Many foods contain sodium for flavor and sometimes as a preservative. When the amount of sodium in a diet needs to be low, it is important to know what to look for when choosing foods and drinks. The following includes some information and guidelines to help make it easier for you to adapt to a low sodium diet. QUICK TIPS  Do not add salt to food.  Avoid convenience items and fast food.  Choose unsalted snack foods.  Buy lower sodium products, often labeled as "lower sodium" or "no salt added."  Check food labels to learn how much sodium is in 1 serving.  When eating at a restaurant, ask that your food be prepared with less salt or none, if possible. READING FOOD LABELS FOR SODIUM INFORMATION The nutrition facts label is a good place to find how much sodium is in foods. Look for products with no more than 500 to 600 mg of sodium per meal and no more than 150 mg per serving. Remember that 2 g = 2000 mg. The food label may also list foods as:  Sodium-free: Less than 5 mg in a serving.  Very low sodium: 35 mg or  less in a serving.  Low-sodium: 140 mg or less in a serving.  Light in sodium: 50% less sodium in a serving. For example, if a food that usually has 300 mg of sodium is changed to become light in sodium, it will have 150 mg of sodium.  Reduced sodium: 25% less sodium in a serving. For example, if a food that usually has 400 mg of sodium is changed to reduced sodium, it will have 300 mg of sodium. CHOOSING FOODS Grains  Avoid: Salted crackers and snack items. Some cereals, including instant hot cereals. Bread stuffing and biscuit mixes. Seasoned rice or pasta mixes.  Choose: Unsalted snack items. Low-sodium cereals, oats, puffed wheat and rice, shredded wheat. English muffins and bread. Pasta. Meats  Avoid: Salted, canned, smoked, spiced, pickled meats, including fish and poultry. Bacon, ham, sausage, cold cuts, hot dogs, anchovies.  Choose: Low-sodium canned tuna and salmon. Fresh or frozen meat, poultry, and fish. Dairy  Avoid: Processed cheese and spreads. Cottage cheese. Buttermilk and condensed milk. Regular cheese.  Choose: Milk. Low-sodium cottage cheese. Yogurt. Sour cream. Low-sodium cheese. Fruits and Vegetables  Avoid: Regular canned vegetables. Regular canned tomato sauce and paste. Frozen vegetables in sauces. Olives. Angie Fava. Relishes. Sauerkraut.  Choose: Low-sodium canned vegetables. Low-sodium tomato sauce and paste. Frozen or fresh vegetables. Fresh and frozen fruit. Condiments  Avoid: Canned and  packaged gravies. Worcestershire sauce. Tartar sauce. Barbecue sauce. Soy sauce. Steak sauce. Ketchup. Onion, garlic, and table salt. Meat flavorings and tenderizers.  Choose: Fresh and dried herbs and spices. Low-sodium varieties of mustard and ketchup. Lemon juice. Tabasco sauce. Horseradish. SAMPLE 2 GRAM SODIUM MEAL PLAN Breakfast / Sodium (mg)  1 cup low-fat milk / 250 mg  2 slices whole-wheat toast / 270 mg  1 tbs heart-healthy margarine / 153 mg  1  hard-boiled egg / 139 mg  1 small orange / 0 mg Lunch / Sodium (mg)  1 cup raw carrots / 76 mg   cup hummus / 298 mg  1 cup low-fat milk / 143 mg   cup red grapes / 2 mg  1 whole-wheat pita bread / 356 mg Dinner / Sodium (mg)  1 cup whole-wheat pasta / 2 mg  1 cup low-sodium tomato sauce / 73 mg  3 oz lean ground beef / 57 mg  1 small side salad (1 cup raw spinach leaves,  cup cucumber,  cup yellow bell pepper) with 1 tsp olive oil and 1 tsp red wine vinegar / 25 mg Snack / Sodium (mg)  1 container low-fat vanilla yogurt / 107 mg  3 graham cracker squares / 127 mg Nutrient Analysis  Calories: 2033  Protein: 77 g  Carbohydrate: 282 g  Fat: 72 g  Sodium: 1971 mg Document Released: 03/01/2005 Document Revised: 05/24/2011 Document Reviewed: 06/02/2009 [employer] Patient Information 2014 [place].

## 2013-04-26 ENCOUNTER — Encounter: Payer: Self-pay | Admitting: Cardiology

## 2013-04-26 ENCOUNTER — Ambulatory Visit (HOSPITAL_COMMUNITY): Payer: Medicare Other | Attending: Physician Assistant

## 2013-04-26 DIAGNOSIS — I6529 Occlusion and stenosis of unspecified carotid artery: Secondary | ICD-10-CM

## 2013-05-03 NOTE — Progress Notes (Signed)
Quick Note:  Patient notified of results from Carotid Doppler study. Patient verbalized agreement with current treatment plan. ______

## 2013-05-04 ENCOUNTER — Ambulatory Visit: Payer: Self-pay | Admitting: Physician Assistant

## 2013-05-31 ENCOUNTER — Other Ambulatory Visit: Payer: Medicare Other

## 2013-05-31 ENCOUNTER — Other Ambulatory Visit: Payer: Self-pay

## 2013-05-31 ENCOUNTER — Ambulatory Visit: Payer: Self-pay | Admitting: Physician Assistant

## 2013-05-31 DIAGNOSIS — D381 Neoplasm of uncertain behavior of trachea, bronchus and lung: Secondary | ICD-10-CM

## 2013-06-01 ENCOUNTER — Ambulatory Visit (INDEPENDENT_AMBULATORY_CARE_PROVIDER_SITE_OTHER): Payer: Medicare Other | Admitting: Physician Assistant

## 2013-06-01 ENCOUNTER — Encounter: Payer: Self-pay | Admitting: Physician Assistant

## 2013-06-01 VITALS — BP 144/92 | HR 94 | Temp 98.4°F | Ht 60.0 in | Wt 154.4 lb

## 2013-06-01 DIAGNOSIS — L259 Unspecified contact dermatitis, unspecified cause: Secondary | ICD-10-CM

## 2013-06-01 DIAGNOSIS — F17200 Nicotine dependence, unspecified, uncomplicated: Secondary | ICD-10-CM

## 2013-06-01 DIAGNOSIS — Z1231 Encounter for screening mammogram for malignant neoplasm of breast: Secondary | ICD-10-CM

## 2013-06-01 DIAGNOSIS — L989 Disorder of the skin and subcutaneous tissue, unspecified: Secondary | ICD-10-CM

## 2013-06-01 DIAGNOSIS — I6529 Occlusion and stenosis of unspecified carotid artery: Secondary | ICD-10-CM | POA: Diagnosis not present

## 2013-06-01 DIAGNOSIS — I10 Essential (primary) hypertension: Secondary | ICD-10-CM

## 2013-06-01 DIAGNOSIS — L309 Dermatitis, unspecified: Secondary | ICD-10-CM

## 2013-06-01 DIAGNOSIS — Z0000 Encounter for general adult medical examination without abnormal findings: Secondary | ICD-10-CM | POA: Diagnosis not present

## 2013-06-01 DIAGNOSIS — Z1211 Encounter for screening for malignant neoplasm of colon: Secondary | ICD-10-CM

## 2013-06-01 DIAGNOSIS — E785 Hyperlipidemia, unspecified: Secondary | ICD-10-CM

## 2013-06-01 DIAGNOSIS — K219 Gastro-esophageal reflux disease without esophagitis: Secondary | ICD-10-CM

## 2013-06-01 NOTE — Progress Notes (Signed)
Patient ID: AVELEEN Wyatt is a 68 y.o. female DOB: [phone] MRN: [id number]     HPI:  Patient is a 68 year old female who presents to the office to establish care. Has not seen a PCP in nearly seven years. Reports she does see a Cardiologist, Dr. Burt Knack, for her CAD. Reports heart stent placed in 1996. Cardiologist currently treating her for carotid stenosis. Also reports history of HTN, Hyperlipidemia, GERD, ulcers and is a smoker. Reports only smokes eight cigarettes per day and is not interested in quitting. Reports no current GERD or Ulcers issues. GERD well controlled with Nexium. States she is current with all her medications. Reports a rash to both eyelids that has been present for a couple years and is now below her left eye. Reports rash is not irritating or itching. Also reports has had history of genital warts and currently has one on her right inner thigh. States she does not want to have removed today, just wanted to inform of its presence. Currently denies chest pain/palpitations, DOB, cough, N/V/F, visual change/disturbances, change in bowel/bladder habits, blood in urine/stool, lightheaded, dizziness or weakness.   Influenza: does not take Pneumonia: does not want Tetanus: 2011 PAP: 1972 LMP: 1972 Mammogram: 2003 Eye Dr. 2009 Dentist no, dentures Colonoscopy: no   ROS: As stated in HPI. All other systems negative  Past Medical History  Diagnosis Date  . Coronary artery disease 1966    Status post prior stenting at [facility] (unknown vessel);  Myoview 12/2010:  EF 59%, no ischemia, small anterior wall scar vs breast attenuation  . HTN (hypertension)   . Hyperlipidemia   . Tobacco abuse   . Arm numbness left     felt to be secondary to disk degeneration  . Myocardial infarction   . carotid stenosis 01/21/2011  . Chest pain 01/21/2011  . Incidental lung nodule, > 12mm and < 9mm 07/04/2012    Pulmonary nodule in the right upper lobe measures 4 mm. If the patient is  at high risk for bronchogenic carcinoma, follow-up chest CT at 1 year is recommended.  If the patient is at low risk, no follow-up is needed.  This recommendation follows the consensus statement: Guidelines for Management of Small Pulmonary Nodules Detected on CT Scans:  A Statement from the [employer] as published in Radiology 2005; 237:395-400.  For CT one year from 06/16/2012    Family History  Problem Relation Age of Onset  . Diabetes Mother   . Heart disease Father   . Heart attack Father   . Heart failure Father   . COPD Father   . Heart disease Sister   . Diabetes Sister    History   Social History  . Marital Status: Legally Separated    Spouse Name: N/A    Number of Children: N/A  . Years of Education: N/A   Social History Main Topics  . Smoking status: Current Every Day Smoker  . Smokeless tobacco: None  . Alcohol Use: No  . Drug Use: No  . Sexual Activity: None   Other Topics Concern  . None   Social History Narrative  . None   Past Surgical History  Procedure Laterality Date  . Other surgical history       HYSTERECTOMY  . Coronary stent placement    . Abdominal hysterectomy    . Hiatal hernia repair     Current Outpatient Prescriptions on File Prior to Visit  Medication Sig Dispense Refill  .  acetaminophen (TYLENOL) 500 MG tablet Take 500 mg by mouth 3 (three) times daily as needed for pain. For pain      . carvedilol (COREG) 6.25 MG tablet Take 1 tablet (6.25 mg total) by mouth 2 (two) times daily with a meal.  60 tablet  11  . lisinopril (PRINIVIL,ZESTRIL) 20 MG tablet Take 1 tablet (20 mg total) by mouth daily.  90 tablet  0  . nitroGLYCERIN (NITROSTAT) 0.4 MG SL tablet Place 1 tablet (0.4 mg total) under the tongue every 5 (five) minutes as needed.  25 tablet  4  . simvastatin (ZOCOR) 40 MG tablet Take 1 tablet (40 mg total) by mouth at bedtime.  90 tablet  3   No current facility-administered medications on file prior to visit.   Allergies    Allergen Reactions  . Aspirin     GI Upset    PE:  Filed Vitals:   06/01/13 1007  BP: 144/92  Pulse: 94  Temp: 98.4 F (36.9 C)   BP Readings from Last 3 Encounters:  06/01/13 144/92  04/18/13 150/84  07/04/12 171/104    CONSTITUTIONAL: Well developed, well nourished, pleasant, appears stated age, in NAD HEENT: normocephalic, atraumatic, bilateral ext/int canals normal. Bilateral TM's without injections, bulging, erythema. Nose normal, uvula midline, oropharynx clear and moist. Upper dentures present, lower plate absent.  EYES: PERRLA, bilateral EOM and conjunctiva normal, no icterus NECK: FROM, supple, without thyromegaly or mass, bilateral carotid bruits noted. CARDIO: RRR, normal S1 and S2, murmur heard, distal pulses intact. PULM/CHEST CTA bilateral, no wheezes, rales or rhonchi. Non tender. ABD: appearance normal, soft, nontender. Normal bowel sounds x 4 quadrants, non palpable kidney, spleen or liver. GU: deferred. MUSC: FROM U/LE bilateral.  LYMPH: no cervical, supraclavicular adenopathy NEURO: alert and oriented x 3, no cranial nerve deficit, motor strength and coordination NL. DTR's intact.  Negative romberg. Gait normal. SKIN: warm, dry, bilateral eyelids with arch shaped edematous areas, same type area to under left eye, not communicating with the palpebral border. Areas are not vesicular, erythematous or with tactile heat. Right upper medial thigh with pedunculated lesion. Approximately 2.0 cm in circumference.  PSYCH: Mood and affect normal, speech normal.   Lab Results  Component Value Date   WBC 7.5 06/08/2012   HGB 13.3 06/08/2012   HCT 39.2 06/08/2012   PLT 249 06/08/2012   GLUCOSE 110* 06/08/2012   CHOL 257* 06/01/2011   TRIG 180.0* 06/01/2011   HDL 39.40 06/01/2011   LDLDIRECT 168.3 06/01/2011   LDLCALC 167* 01/09/2011   ALT 11 04/05/2011   AST 15 04/05/2011   NA 138 06/08/2012   K 4.3 06/08/2012   CL 102 06/08/2012   CREATININE 0.82 06/08/2012   BUN 24*  06/08/2012   CO2 25 06/08/2012   TSH 0.828 01/08/2011   INR 0.92 01/08/2011   HGBA1C 6.2* 01/08/2011     ASSESSMENT and PLAN   CPX/v70.0 - Patient has been counseled on age-appropriate routine health concerns for screening and prevention. These are reviewed and up-to-date. Immunizations are up-to-date or declined. Labs ordered and will be reviewed.  HM today Referral to GI for screening colonoscopy Mammogram ordered Referral to GYN   GERD Continue with Nexium once daily  CAD/carotid stenosis Continue with current medications Keep scheduled follow up appointments with cardiology  Hyperlipidemia Continue with simvastatin 40 mg once daily  Skin lesion/dermatitis  Referral for dermatology  Smoker Not interested in quitting at this time.  HTN: Continue with Lisinopril 20 mg daily Elevated  today Patient reports not her normal bp, normal 140/80's I repeated while in office, my read 140/88

## 2013-06-01 NOTE — Progress Notes (Signed)
Pre visit review using our clinic review tool, if applicable. No additional management support is needed unless otherwise documented below in the visit note. 

## 2013-06-01 NOTE — Patient Instructions (Signed)
It was great to meet you today [name]!  Labs have been ordered for you, when you report to lab please be fasting.   Health Maintenance, Female A healthy lifestyle and preventative care can promote health and wellness.  Maintain regular health, dental, and eye exams.  Eat a healthy diet. Foods like vegetables, fruits, whole grains, low-fat dairy products, and lean protein foods contain the nutrients you need without too many calories. Decrease your intake of foods high in solid fats, added sugars, and salt. Get information about a proper diet from your caregiver, if necessary.  Regular physical exercise is one of the most important things you can do for your health. Most adults should get at least 150 minutes of moderate-intensity exercise (any activity that increases your heart rate and causes you to sweat) each week. In addition, most adults need muscle-strengthening exercises on 2 or more days a week.   Maintain a healthy weight. The body mass index (BMI) is a screening tool to identify possible weight problems. It provides an estimate of body fat based on height and weight. Your caregiver can help determine your BMI, and can help you achieve or maintain a healthy weight. For adults 20 years and older:  A BMI below 18.5 is considered underweight.  A BMI of 18.5 to 24.9 is normal.  A BMI of 25 to 29.9 is considered overweight.  A BMI of 30 and above is considered obese.  Maintain normal blood lipids and cholesterol by exercising and minimizing your intake of saturated fat. Eat a balanced diet with plenty of fruits and vegetables. Blood tests for lipids and cholesterol should begin at age 47 and be repeated every 5 years. If your lipid or cholesterol levels are high, you are over 50, or you are a high risk for heart disease, you may need your cholesterol levels checked more frequently.Ongoing high lipid and cholesterol levels should be treated with medicines if diet and exercise are  not effective.  If you smoke, find out from your caregiver how to quit. If you do not use tobacco, do not start.  Lung cancer screening is recommended for adults aged 60 80 years who are at high risk for developing lung cancer because of a history of smoking. Yearly low-dose computed tomography (CT) is recommended for people who have at least a 30-pack-year history of smoking and are a current smoker or have quit within the past 15 years. A pack year of smoking is smoking an average of 1 pack of cigarettes a day for 1 year (for example: 1 pack a day for 30 years or 2 packs a day for 15 years). Yearly screening should continue until the smoker has stopped smoking for at least 15 years. Yearly screening should also be stopped for people who develop a health problem that would prevent them from having lung cancer treatment.  If you are pregnant, do not drink alcohol. If you are breastfeeding, be very cautious about drinking alcohol. If you are not pregnant and choose to drink alcohol, do not exceed 1 drink per day. One drink is considered to be 12 ounces (355 mL) of beer, 5 ounces (148 mL) of wine, or 1.5 ounces (44 mL) of liquor.  Avoid use of street drugs. Do not share needles with anyone. Ask for help if you need support or instructions about stopping the use of drugs.  High blood pressure causes heart disease and increases the risk of stroke. Blood pressure should be checked at least every 1  to 2 years. Ongoing high blood pressure should be treated with medicines, if weight loss and exercise are not effective.  If you are 12 to 66 years old, ask your caregiver if you should take aspirin to prevent strokes.  Diabetes screening involves taking a blood sample to check your fasting blood sugar level. This should be done once every 3 years, after age 63, if you are within normal weight and without risk factors for diabetes. Testing should be considered at a younger age or be carried out more frequently if  you are overweight and have at least 1 risk factor for diabetes.  Breast cancer screening is essential preventative care for women. You should practice "breast self-awareness." This means understanding the normal appearance and feel of your breasts and may include breast self-examination. Any changes detected, no matter how small, should be reported to a caregiver. Women in their 65s and 30s should have a clinical breast exam (CBE) by a caregiver as part of a regular health exam every 1 to 3 years. After age 28, women should have a CBE every year. Starting at age 76, women should consider having a mammogram (breast X-ray) every year. Women who have a family history of breast cancer should talk to their caregiver about genetic screening. Women at a high risk of breast cancer should talk to their caregiver about having an MRI and a mammogram every year.  Breast cancer gene (BRCA)-related cancer risk assessment is recommended for women who have family members with BRCA-related cancers. BRCA-related cancers include breast, ovarian, tubal, and peritoneal cancers. Having family members with these cancers may be associated with an increased risk for harmful changes (mutations) in the breast cancer genes BRCA1 and BRCA2. Results of the assessment will determine the need for genetic counseling and BRCA1 and BRCA2 testing.  The Pap test is a screening test for cervical cancer. Women should have a Pap test starting at age 36. Between ages 36 and 25, Pap tests should be repeated every 2 years. Beginning at age 64, you should have a Pap test every 3 years as long as the past 3 Pap tests have been normal. If you had a hysterectomy for a problem that was not cancer or a condition that could lead to cancer, then you no longer need Pap tests. If you are between ages 38 and 24, and you have had normal Pap tests going back 10 years, you no longer need Pap tests. If you have had past treatment for cervical cancer or a condition  that could lead to cancer, you need Pap tests and screening for cancer for at least 20 years after your treatment. If Pap tests have been discontinued, risk factors (such as a new sexual partner) need to be reassessed to determine if screening should be resumed. Some women have medical problems that increase the chance of getting cervical cancer. In these cases, your caregiver may recommend more frequent screening and Pap tests.  The human papillomavirus (HPV) test is an additional test that may be used for cervical cancer screening. The HPV test looks for the virus that can cause the cell changes on the cervix. The cells collected during the Pap test can be tested for HPV. The HPV test could be used to screen women aged 28 years and older, and should be used in women of any age who have unclear Pap test results. After the age of 53, women should have HPV testing at the same frequency as a Pap test.  Colorectal  cancer can be detected and often prevented. Most routine colorectal cancer screening begins at the age of 66 and continues through age 45. However, your caregiver may recommend screening at an earlier age if you have risk factors for colon cancer. On a yearly basis, your caregiver may provide home test kits to check for hidden blood in the stool. Use of a small camera at the end of a tube, to directly examine the colon (sigmoidoscopy or colonoscopy), can detect the earliest forms of colorectal cancer. Talk to your caregiver about this at age 63, when routine screening begins. Direct examination of the colon should be repeated every 5 to 10 years through age 5, unless early forms of pre-cancerous polyps or small growths are found.  Hepatitis C blood testing is recommended for all people born from [birth year] through 1963 and any individual with known risks for hepatitis C.  Practice safe sex. Use condoms and avoid high-risk sexual practices to reduce the spread of sexually transmitted infections (STIs).  Sexually active women aged 37 and younger should be checked for Chlamydia, which is a common sexually transmitted infection. Older women with new or multiple partners should also be tested for Chlamydia. Testing for other STIs is recommended if you are sexually active and at increased risk.  Osteoporosis is a disease in which the bones lose minerals and strength with aging. This can result in serious bone fractures. The risk of osteoporosis can be identified using a bone density scan. Women ages 32 and over and women at risk for fractures or osteoporosis should discuss screening with their caregivers. Ask your caregiver whether you should be taking a calcium supplement or vitamin D to reduce the rate of osteoporosis.  Menopause can be associated with physical symptoms and risks. Hormone replacement therapy is available to decrease symptoms and risks. You should talk to your caregiver about whether hormone replacement therapy is right for you.  Use sunscreen. Apply sunscreen liberally and repeatedly throughout the day. You should seek shade when your shadow is shorter than you. Protect yourself by wearing long sleeves, pants, a wide-brimmed hat, and sunglasses year round, whenever you are outdoors.  Notify your caregiver of new moles or changes in moles, especially if there is a change in shape or color. Also notify your caregiver if a mole is larger than the size of a pencil eraser.  Stay current with your immunizations. Document Released: 09/14/2010 Document Revised: 06/26/2012 Document Reviewed: 09/14/2010 [facility] Patient Information 2014 [place].   Hypertension Hypertension is another name for high blood pressure. High blood pressure may mean that your heart needs to work harder to pump blood. Blood pressure consists of two numbers, which includes a higher number over a lower number (example: 110/72). HOME CARE   Make lifestyle changes as told by your doctor. This may include weight  loss and exercise.  Take your blood pressure medicine every day.  Limit how much salt you use.  Stop smoking if you smoke.  Do not use drugs.  Talk to your doctor if you are using decongestants or birth control pills. These medicines might make blood pressure higher.  Females should not drink more than 1 alcoholic drink per day. Males should not drink more than 2 alcoholic drinks per day.  See your doctor as told. GET HELP RIGHT AWAY IF:   You have a blood pressure reading with a top number of 180 or higher.  You get a very bad headache.  You get blurred or changing vision.  You feel confused.  You feel weak, numb, or faint.  You get chest or belly (abdominal) pain.  You throw up (vomit).  You cannot breathe very well. MAKE SURE YOU:   Understand these instructions.  Will watch your condition.  Will get help right away if you are not doing well or get worse. Document Released: 08/18/2007 Document Revised: 05/24/2011 Document Reviewed: 08/18/2007 [facility] Patient Information 2014 [place], [place].   Cholesterol Cholesterol is a type of fat. Your body needs a small amount of cholesterol, but too much can cause health problems. Certain problems include heart attacks, strokes, and not enough blood flow to your heart, brain, kidneys, or feet. You get cholesterol in 2 ways:  Naturally.  By eating certain foods. HOME CARE  Eat a low-fat diet:  Eat less eggs, whole dairy products (whole milk, cheese, and butter), fatty meats, and fried foods.  Eat more fruits, vegetables, whole-wheat breads, lean chicken, and fish.  Follow your exercise program as told by your doctor.  Keep your weight at a healthy level. Talk to your doctor about what is right for you.  Only take medicine as told by your doctor.  Get your cholesterol checked once a year or as told by your doctor. MAKE SURE YOU:  Understand these instructions.  Will watch your condition.  Will get help  right away if you are not doing well or get worse. Document Released: 05/28/2008 Document Revised: 05/24/2011 Document Reviewed: 12/13/2012 [facility] Patient Information 2014 [place], [place].

## 2013-06-05 ENCOUNTER — Encounter: Payer: Self-pay | Admitting: Gastroenterology

## 2013-06-06 ENCOUNTER — Other Ambulatory Visit: Payer: Self-pay | Admitting: Physician Assistant

## 2013-06-06 DIAGNOSIS — Z1231 Encounter for screening mammogram for malignant neoplasm of breast: Secondary | ICD-10-CM

## 2013-06-20 ENCOUNTER — Encounter: Payer: Self-pay | Admitting: Medical

## 2013-06-28 ENCOUNTER — Ambulatory Visit
Admission: RE | Admit: 2013-06-28 | Discharge: 2013-06-28 | Disposition: A | Discharge status: Home / Self Care | Payer: Medicare Other | Source: Ambulatory Visit | Attending: Physician Assistant | Admitting: Physician Assistant

## 2013-06-28 ENCOUNTER — Other Ambulatory Visit (INDEPENDENT_AMBULATORY_CARE_PROVIDER_SITE_OTHER): Payer: Medicare Other

## 2013-06-28 DIAGNOSIS — L309 Dermatitis, unspecified: Secondary | ICD-10-CM

## 2013-06-28 DIAGNOSIS — F17200 Nicotine dependence, unspecified, uncomplicated: Secondary | ICD-10-CM

## 2013-06-28 DIAGNOSIS — Z1231 Encounter for screening mammogram for malignant neoplasm of breast: Secondary | ICD-10-CM

## 2013-06-28 DIAGNOSIS — L989 Disorder of the skin and subcutaneous tissue, unspecified: Secondary | ICD-10-CM

## 2013-06-28 DIAGNOSIS — Z0000 Encounter for general adult medical examination without abnormal findings: Secondary | ICD-10-CM | POA: Diagnosis not present

## 2013-06-28 DIAGNOSIS — I6529 Occlusion and stenosis of unspecified carotid artery: Secondary | ICD-10-CM

## 2013-06-28 DIAGNOSIS — I10 Essential (primary) hypertension: Secondary | ICD-10-CM

## 2013-06-28 DIAGNOSIS — E785 Hyperlipidemia, unspecified: Secondary | ICD-10-CM | POA: Diagnosis not present

## 2013-06-28 DIAGNOSIS — L259 Unspecified contact dermatitis, unspecified cause: Secondary | ICD-10-CM

## 2013-06-28 DIAGNOSIS — Z1211 Encounter for screening for malignant neoplasm of colon: Secondary | ICD-10-CM

## 2013-06-28 LAB — LIPID PANEL
CHOL/HDL RATIO: 6
Cholesterol: 306 mg/dL — ABNORMAL HIGH (ref 0–200)
Cholesterol: 308 mg/dL — ABNORMAL HIGH (ref 0–200)
HDL: 47.2 mg/dL (ref >39.00)
HDL: 47.1 mg/dL (ref >39.00)
LDL Cholesterol: 240 mg/dL — ABNORMAL HIGH (ref 0–99)
LDL Cholesterol: 242 mg/dL — ABNORMAL HIGH (ref 0–99)
TRIGLYCERIDES: 92 mg/dL (ref 0.0–149.0)
Total CHOL/HDL Ratio: 7
Triglycerides: 93 mg/dL (ref 0.0–149.0)
VLDL: 18.4 mg/dL (ref 0.0–40.0)
VLDL: 18.6 mg/dL (ref 0.0–40.0)

## 2013-06-28 LAB — URINALYSIS, ROUTINE W REFLEX MICROSCOPIC
NITRITE: NEGATIVE
PH: 6 (ref 5.0–8.0)
Total Protein, Urine: 30 — AB
Urine Glucose: NEGATIVE
Urobilinogen, UA: 0.2 (ref 0.0–1.0)

## 2013-06-28 LAB — HEPATIC FUNCTION PANEL
ALK PHOS: 114 U/L (ref 39–117)
ALT: 16 U/L (ref 0–35)
ALT: 15 U/L (ref 0–35)
AST: 18 U/L (ref 0–37)
AST: 17 U/L (ref 0–37)
Albumin: 3.5 g/dL (ref 3.5–5.2)
Albumin: 3.6 g/dL (ref 3.5–5.2)
Alkaline Phosphatase: 115 U/L (ref 39–117)
BILIRUBIN TOTAL: 0.7 mg/dL (ref 0.3–1.2)
Bilirubin, Direct: 0.1 mg/dL (ref 0.0–0.3)
Bilirubin, Direct: 0.1 mg/dL (ref 0.0–0.3)
Total Bilirubin: 0.7 mg/dL (ref 0.3–1.2)
Total Protein: 7.6 g/dL (ref 6.0–8.3)
Total Protein: 7.6 g/dL (ref 6.0–8.3)

## 2013-06-28 LAB — CBC WITH DIFFERENTIAL/PLATELET
BASOS ABS: 0.1 10*3/uL (ref 0.0–0.1)
Basophils Relative: 1.7 % (ref 0.0–3.0)
Eosinophils Absolute: 0.1 10*3/uL (ref 0.0–0.7)
Eosinophils Relative: 1.1 % (ref 0.0–5.0)
HCT: 39.9 % (ref 36.0–46.0)
HEMOGLOBIN: 13.5 g/dL (ref 12.0–15.0)
LYMPHS PCT: 44.3 % (ref 12.0–46.0)
Lymphs Abs: 3.3 10*3/uL (ref 0.7–4.0)
MCHC: 33.8 g/dL (ref 30.0–36.0)
MCV: 88.9 fl (ref 78.0–100.0)
MONOS PCT: 6.8 % (ref 3.0–12.0)
Monocytes Absolute: 0.5 10*3/uL (ref 0.1–1.0)
NEUTROS ABS: 3.4 10*3/uL (ref 1.4–7.7)
Neutrophils Relative %: 46.1 % (ref 43.0–77.0)
Platelets: 257 10*3/uL (ref 150.0–400.0)
RBC: 4.49 Mil/uL (ref 3.87–5.11)
RDW: 14.3 % (ref 11.5–14.6)
WBC: 7.3 10*3/uL (ref 4.5–10.5)

## 2013-06-28 LAB — TSH: TSH: 1 u[IU]/mL (ref 0.35–5.50)

## 2013-06-28 LAB — BASIC METABOLIC PANEL
BUN: 16 mg/dL (ref 6–23)
CHLORIDE: 104 meq/L (ref 96–112)
CO2: 29 mEq/L (ref 19–32)
Calcium: 9.2 mg/dL (ref 8.4–10.5)
Creatinine, Ser: 0.9 mg/dL (ref 0.4–1.2)
GFR: 81.74 mL/min (ref >60.00)
GLUCOSE: 104 mg/dL — AB (ref 70–99)
POTASSIUM: 3.4 meq/L — AB (ref 3.5–5.1)
SODIUM: 138 meq/L (ref 135–145)

## 2013-07-05 DIAGNOSIS — L723 Sebaceous cyst: Secondary | ICD-10-CM | POA: Diagnosis not present

## 2013-07-05 DIAGNOSIS — D485 Neoplasm of uncertain behavior of skin: Secondary | ICD-10-CM | POA: Diagnosis not present

## 2013-07-05 DIAGNOSIS — H0260 Xanthelasma of unspecified eye, unspecified eyelid: Secondary | ICD-10-CM | POA: Diagnosis not present

## 2013-07-06 ENCOUNTER — Other Ambulatory Visit: Payer: Self-pay | Admitting: Internal Medicine

## 2013-07-06 ENCOUNTER — Other Ambulatory Visit: Payer: Self-pay | Admitting: *Deleted

## 2013-07-06 DIAGNOSIS — L919 Hypertrophic disorder of the skin, unspecified: Secondary | ICD-10-CM | POA: Diagnosis not present

## 2013-07-06 DIAGNOSIS — L909 Atrophic disorder of skin, unspecified: Secondary | ICD-10-CM | POA: Diagnosis not present

## 2013-07-06 DIAGNOSIS — R319 Hematuria, unspecified: Secondary | ICD-10-CM

## 2013-07-06 MED ORDER — SIMVASTATIN 40 MG PO TABS: 40.0000 mg | ORAL_TABLET | Freq: Every day | ORAL | Status: DC

## 2013-07-06 NOTE — Telephone Encounter (Signed)
Notified pt with lab results. Needing refills on simvastatin sent to [facility]...[name]

## 2013-07-12 ENCOUNTER — Encounter: Payer: Self-pay | Admitting: Cardiothoracic Surgery

## 2013-07-12 ENCOUNTER — Other Ambulatory Visit: Payer: Self-pay | Admitting: *Deleted

## 2013-07-12 ENCOUNTER — Ambulatory Visit
Admission: RE | Admit: 2013-07-12 | Discharge: 2013-07-12 | Disposition: A | Discharge status: Home / Self Care | Payer: Medicare Other | Source: Ambulatory Visit | Attending: Cardiothoracic Surgery | Admitting: Cardiothoracic Surgery

## 2013-07-12 ENCOUNTER — Ambulatory Visit (INDEPENDENT_AMBULATORY_CARE_PROVIDER_SITE_OTHER): Payer: Medicare Other | Admitting: Cardiothoracic Surgery

## 2013-07-12 VITALS — BP 166/95 | HR 90 | Resp 16 | Ht 60.0 in | Wt 151.0 lb

## 2013-07-12 DIAGNOSIS — R911 Solitary pulmonary nodule: Secondary | ICD-10-CM

## 2013-07-12 DIAGNOSIS — Z720 Tobacco use: Secondary | ICD-10-CM

## 2013-07-12 DIAGNOSIS — D381 Neoplasm of uncertain behavior of trachea, bronchus and lung: Secondary | ICD-10-CM

## 2013-07-12 MED ORDER — IOHEXOL 300 MG/ML  SOLN
75.0000 mL | Freq: Once | INTRAMUSCULAR | Status: AC | PRN
  Administered 2013-07-12: 75 mL via INTRAVENOUS

## 2013-07-12 NOTE — Patient Instructions (Signed)
Radiology to contact you about lung cancer screening in one year   Smoking Cessation, Tips for Success If you are ready to quit smoking, congratulations! You have chosen to help yourself be healthier. Cigarettes bring nicotine, tar, carbon monoxide, and other irritants into your body. Your lungs, heart, and blood vessels will be able to work better without these poisons. There are many different ways to quit smoking. Nicotine gum, nicotine patches, a nicotine inhaler, or nicotine nasal spray can help with physical craving. Hypnosis, support groups, and medicines help break the habit of smoking. WHAT THINGS CAN I DO TO MAKE QUITTING EASIER?  Here are some tips to help you quit for good:  Pick a date when you will quit smoking completely. Tell all of your friends and family about your plan to quit on that date.  Do not try to slowly cut down on the number of cigarettes you are smoking. Pick a quit date and quit smoking completely starting on that day.  Throw away all cigarettes.   Clean and remove all ashtrays from your home, work, and car.   On a card, write down your reasons for quitting. Carry the card with you and read it when you get the urge to smoke.   Cleanse your body of nicotine. Drink enough water and fluids to keep your urine clear or pale yellow. Do this after quitting to flush the nicotine from your body.   Learn to predict your moods. Do not let a bad situation be your excuse to have a cigarette. Some situations in your life might tempt you into wanting a cigarette.   Never have "just one" cigarette. It leads to wanting another and another. Remind yourself of your decision to quit.   Change habits associated with smoking. If you smoked while driving or when feeling stressed, try other activities to replace smoking. Stand up when drinking your coffee. Brush your teeth after eating. Sit in a different chair when you read the paper. Avoid alcohol while trying to quit, and try  to drink fewer caffeinated beverages. Alcohol and caffeine may urge you to smoke.   Avoid foods and drinks that can trigger a desire to smoke, such as sugary or spicy foods and alcohol.   Ask people who smoke not to smoke around you.   Have something planned to do right after eating or having a cup of coffee. For example, plan to take a walk or exercise.   Try a relaxation exercise to calm you down and decrease your stress. Remember, you may be tense and nervous for the first 2 weeks after you quit, but this will pass.   Find new activities to keep your hands busy. Play with a pen, coin, or rubber band. Doodle or draw things on paper.   Brush your teeth right after eating. This will help cut down on the craving for the taste of tobacco after meals. You can also try mouthwash.   Use oral substitutes in place of cigarettes. Try using lemon drops, carrots, cinnamon sticks, or chewing gum. Keep them handy so they are available when you have the urge to smoke.   When you have the urge to smoke, try deep breathing.   Designate your home as a nonsmoking area.   If you are a heavy smoker, ask your health care provider about a prescription for nicotine chewing gum. It can ease your withdrawal from nicotine.   Reward yourself. Set aside the cigarette money you save and buy yourself something  nice.   Look for support from others. Join a support group or smoking cessation program. Ask someone at home or at work to help you with your plan to quit smoking.   Always ask yourself, "Do I need this cigarette or is this just a reflex?" Tell yourself, "Today, I choose not to smoke," or "I do not want to smoke." You are reminding yourself of your decision to quit.  Do not replace cigarette smoking with electronic cigarettes (commonly called e-cigarettes). The safety of e-cigarettes is unknown, and some may contain harmful chemicals.  If you relapse, do not give up! Plan ahead and think about  what you will do the next time you get the urge to smoke.  HOW WILL I FEEL WHEN I QUIT SMOKING? You may have symptoms of withdrawal because your body is used to nicotine (the addictive substance in cigarettes). You may crave cigarettes, be irritable, feel very hungry, cough often, get headaches, or have difficulty concentrating. The withdrawal symptoms are only temporary. They are strongest when you first quit but will go away within 10 14 days. When withdrawal symptoms occur, stay in control. Think about your reasons for quitting. Remind yourself that these are signs that your body is healing and getting used to being without cigarettes. Remember that withdrawal symptoms are easier to treat than the major diseases that smoking can cause.  Even after the withdrawal is over, expect periodic urges to smoke. However, these cravings are generally short lived and will go away whether you smoke or not. Do not smoke!  WHAT RESOURCES ARE AVAILABLE TO HELP ME QUIT SMOKING? Your health care provider can direct you to community resources or hospitals for support, which may include:  Group support.  Education.  Hypnosis.  Therapy. Document Released: 11/28/2003 Document Revised: 12/20/2012 Document Reviewed: 08/17/2012 [facility] Patient Information 2014 [place], [place].

## 2013-07-12 NOTE — Progress Notes (Signed)
[address]       [place] [address]             [phone]         [name] [place] Medical Record [id number] Date of Birth: 1945-11-29  Referring: Aura Dials, MD Primary Care: same  Chief Complaint:    Chief Complaint  Patient presents with  . Lung Lesion    1 yr f/u with CT CHEST    History of Present Illness:    Patient is a 67 year old female with a 20-25 year history of smoking 5-6 cigarettes a day who is referred for the incidental finding of a 4 mm lung nodule in the right upper lobe 06/2012. The patient notes that she fell in a store on 05/24/2012 when her pants leg got caught on a pallet and she fell on the floor. She notes about 6 days after she fell she had increasing anterior chest wall pain and shortness of breath and [facility]. She was evaluated that time and told she had not had a myocardial infarction but she does have a history of coronary artery disease. A CT scan of the chest down the incidental 4 mm right upper lobe lung nodule without other evidence of lung cancer. The radiographic recommendation was for a CT scan of the chest in one year. The patient wished to see a thoracic surgeon before she went along with this recommendation. She was seen and repeat scan this year was recommended.    Stopped smoking but with recent death of her mother last month she started  Back again  Current Activity/ Functional Status:  Patient is independent with mobility/ambulation, transfers, ADL's, IADL's.  Zubrod Score: At the time of surgery this patient's most appropriate activity status/level should be described as: [x]  Normal activity, no symptoms []  Symptoms, fully ambulatory []  Symptoms, in bed less than or equal to 50% of the time []  Symptoms, in bed greater than 50% of the time but less than 100% []  Bedridden []  Moribund   Past Medical History  Diagnosis Date  . Coronary artery disease 1966   Status post prior stenting at [facility] (unknown vessel);  Myoview 12/2010:  EF 59%, no ischemia, small anterior wall scar vs breast attenuation  . HTN (hypertension)   . Hyperlipidemia   . Tobacco abuse   . Arm numbness left     felt to be secondary to disk degeneration  . Myocardial infarction   . carotid stenosis 01/21/2011  . Chest pain 01/21/2011  . Incidental lung nodule, > 32mm and < 84mm 07/04/2012    Pulmonary nodule in the right upper lobe measures 4 mm. If the patient is at high risk for bronchogenic carcinoma, follow-up chest CT at 1 year is recommended.  If the patient is at low risk, no follow-up is needed.  This recommendation follows the consensus statement: Guidelines for Management of Small Pulmonary Nodules Detected on CT Scans:  A Statement from the [place] as published in Radiology 2005; 237:395-400.  For CT one year from 06/16/2012     Past Surgical History  Procedure Laterality Date  . Other surgical history       HYSTERECTOMY  . Coronary stent placement    . Abdominal hysterectomy    . Hiatal hernia repair      Family History  Problem Relation Age of Onset  . Diabetes Mother   . Heart disease Father   .  Heart attack Father   . Heart failure Father   . COPD Father     History   Social History  . Marital Status: Legally Separated    Spouse Name: N/A    Number of Children: N/A  . Years of Education: N/A   Occupational History  . Not on file.   Social History Main Topics  . Smoking status: Current Every Day Smoker  . Smokeless tobacco: Not on file  . Alcohol Use: No  . Drug Use: No         History  Smoking status  . Current Every Day Smoker  Smokeless tobacco  . Not on file    History  Alcohol Use No     Allergies  Allergen Reactions  . Aspirin     GI Upset    Current Outpatient Prescriptions  Medication Sig Dispense Refill  . acetaminophen (TYLENOL) 500 MG tablet Take 500 mg by mouth 3 (three) times daily as needed for  pain. For pain      . carvedilol (COREG) 6.25 MG tablet Take 1 tablet (6.25 mg total) by mouth 2 (two) times daily with a meal.  60 tablet  11  . lisinopril (PRINIVIL,ZESTRIL) 20 MG tablet Take 1 tablet (20 mg total) by mouth daily.  90 tablet  0  . nitroGLYCERIN (NITROSTAT) 0.4 MG SL tablet Place 1 tablet (0.4 mg total) under the tongue every 5 (five) minutes as needed.  25 tablet  4  . simvastatin (ZOCOR) 40 MG tablet Take 1 tablet (40 mg total) by mouth at bedtime.  90 tablet  3   No current facility-administered medications for this visit.       Review of Systems:     Cardiac Review of Systems: Y or N  Chest Pain [  n  ]  Resting SOB [  n ] Exertional SOB  [ n ]  Orthopnea [ n ]   Pedal Edema [ n  ]    Palpitations [ n ] Syncope  [ n ]   Presyncope [  n ]  General Review of Systems: [Y] = yes [  ]=no Constitional: recent weight change [ n ]; anorexia [  ]; fatigue [  ]; nausea [  ]; night sweats [  ]; fever [  ]; or chills [  ];                                                                                                                                          Dental: poor dentition[no teeth  ]; Last Dentist visit:   Eye : blurred vision [  ]; diplopia [   ]; vision changes [  ];  Amaurosis fugax[  ]; Resp: cough [  ];  wheezing[n  ];  hemoptysis[ n ]; shortness of breath[ n ]; paroxysmal nocturnal dyspnea[ n ]; dyspnea on exertion[  ]; or  orthopnea[  ];  GI:  gallstones[ on ct scan ], vomiting[  ];  dysphagia[  ]; melena[  ];  hematochezia [  ]; heartburn[  ];   Hx of  Colonoscopy[ to be done Jul 31 2013 ]; gets mammogram yearly had this month GU: kidney stones [  ]; hematuria[  ];   dysuria [  ];  nocturia[  ];  history of     obstruction [  ]; urinary frequency [  ]             Skin: rash, swelling[  ];, hair loss[  ];  peripheral edema[  ];  or itching[  ]; Musculosketetal: myalgias[  ];  joint swelling[  ];  joint erythema[  ];  joint pain[  ];  back pain[  ];  Heme/Lymph:  bruising[  ];  bleeding[  ];  anemia[  ];  Neuro: TIA[  ];  headaches[  ];  stroke[ n ];  vertigo[  ];  seizures[  ];   paresthesias[  ];  difficulty walking[ n ];  Psych:depression[  ]; anxiety[  ];  Endocrine: diabetes[ n ];  thyroid dysfunction[  ];  Immunizations: Flu [does not take ]; Pneumococcal[ does not take ];  Other:  Physical Exam: Ht 5' (1.524 m)  Wt 151 lb (68.493 kg)  BMI 29.49 kg/m2  General appearance: alert, cooperative, appears stated age and no distress Neurologic: intact Heart: regular rate and rhythm, S1, S2 normal, no murmur, click, rub or gallop and normal apical impulse Lungs: clear to auscultation bilaterally and normal percussion bilaterally Abdomen: soft, non-tender; bowel sounds normal; no masses,  no organomegaly Extremities: extremities normal, atraumatic, no cyanosis or edema and Homans sign is negative, no sign of DVT Patient has known carotid disease, I do not appreciate any carotid bruits  Diagnostic Studies & Laboratory data:     Recent Radiology Findings: Ct Chest W Contrast  07/12/2013   CLINICAL DATA:  Followup pulmonary nodule  EXAM: CT CHEST WITH CONTRAST  TECHNIQUE: Multidetector CT imaging of the chest was performed during intravenous contrast administration.  CONTRAST:  41mL OMNIPAQUE IOHEXOL 300 MG/ML  SOLN  COMPARISON:  CT CHEST W/O CM dated 06/16/2012; DG CHEST 2 VIEW dated 06/08/2012  FINDINGS: Stable 3-4 mm nodule right lung apex. Incidentally detected 14 mm right thyroid nodule. No suspicious features. Aortic arch and coronary arterial calcification with calcification of descending thoracic aorta, all stable. 3 mm left nonobstructing renal stones stable. Similar to prior study there is mild to moderate thickening of the wall of the distal esophagus.  No significant adenopathy, parenchymal consolidation or effusion, or pleural or pericardial effusion. No acute musculoskeletal findings.  IMPRESSION: Stable 4 mm right apical pulmonary nodule, which  can be considered benign given the stability. This recommendation follows the consensus statement: Guidelines for Management of Small Pulmonary Nodules Detected on CT Scans: A Statement from the [place] as published in Radiology 2005; 237:395-400.   Electronically Signed   By: Skipper Cliche M.D.   On: 07/12/2013 09:23    Dg Chest 2 View  06/08/2012  *RADIOLOGY REPORT*  Clinical Data: [place].  Chest pain.  CHEST - 2 VIEW  Comparison: 01/08/2011.  Findings: The cardiac silhouette, mediastinal and hilar contours are normal and stable.  The lungs are clear.  No pleural effusion, pleural thickening or pneumothorax.  The bony thorax is intact.  IMPRESSION: No acute cardiopulmonary findings and intact bony thorax.   Original Report Authenticated By: Marijo Sanes, M.D.  Ct Chest Wo Contrast  06/16/2012  *RADIOLOGY REPORT*  Clinical Data: Contusion to sternum and ribs  CT CHEST WITHOUT CONTRAST  Technique:  Multidetector CT imaging of the chest was performed following the standard protocol without IV contrast.  Comparison: None  Findings:  Lungs/pleura: There is no pleural effusion.  There is no airspace consolidation or atelectasis identified.  No evidence for pulmonary contusion.  Right upper lobe nodule measures 4 mm, image 9/series 3.  Heart/Mediastinum: The heart size is normal.  No pericardial effusion.  Calcification within the LAD coronary artery noted. There is no enlarged mediastinal or hilar lymph nodes identified. Left-sided superior vena cava is noted.  Upper abdomen: There is a 3 mm stone within the upper pole of the left kidney, image 53/series 2.  Bones/Musculoskeletal:  No supraclavicular or axillary adenopathy. The sternum appears intact.  IMPRESSION:  1.  No acute findings. 2.  Pulmonary nodule in the right upper lobe measures 4 mm. If the patient is at high risk for bronchogenic carcinoma, follow-up chest CT at 1 year is recommended.  If the patient is at low risk, no follow-up is  needed.  This recommendation follows the consensus statement: Guidelines for Management of Small Pulmonary Nodules Detected on CT Scans:  A Statement from the [place] as published in Radiology 2005; 237:395-400. 3.  Coronary artery calcifications.   Original Report Authenticated By: Kerby Moors, M.D.     Recent Lab Findings: Lab Results  Component Value Date   WBC 7.3 06/28/2013   HGB 13.5 06/28/2013   HCT 39.9 06/28/2013   PLT 257.0 06/28/2013   GLUCOSE 104* 06/28/2013   CHOL 308* 06/28/2013   TRIG 93.0 06/28/2013   HDL 47.10 06/28/2013   LDLDIRECT 168.3 06/01/2011   LDLCALC 242* 06/28/2013   ALT 16 06/28/2013   AST 18 06/28/2013   NA 138 06/28/2013   K 3.4* 06/28/2013   CL 104 06/28/2013   CREATININE 0.9 06/28/2013   BUN 16 06/28/2013   CO2 29 06/28/2013   TSH 1.00 06/28/2013   INR 0.92 01/08/2011   HGBA1C 6.2* 01/08/2011   10 minute of the office visit was spent discussing with the patient strategies to stop smoking, and to help me to do so.    Assessment / Plan:   Pulmonary Nodule in the right upper lobe measures 4 mm- first noted on CT scan 2014 now unchanged one year later pursuing to be benign. Because of the patient's risk of lung cancer she will be referred to the lung cancer screening program for additional low-dose CT scans for screening. The next scan will be in one year History of tobacco use, patient was referred to the outpatient smoking cessation class, she quit smoking for a period of time but now has resumed with the recent death of her mother. Known CAD with stent place at [facility], coronary calcification on CT of Chest- denies any current chest pain Hypertension. - on  Coreg to 6.25 mg twice daily and  lisinopril 20 mg daily. Blood pressure is borderline today- I discussed with the patient the need for good blood pressure control and the importance of taking her blood pressure medicine as prescribed she noted she is currently run out of it and did not take it  this morning. Dyslipidemia. On Zocor but is unclear she's actually taking History of Carotid stenosis. The patient had 40-59% bilateral ICA stenosis asymptomatic   Grace Isaac MD  Beeper [phone] Office [phone] 07/12/2013 10:10 AM

## 2013-07-18 ENCOUNTER — Encounter: Payer: PRIVATE HEALTH INSURANCE | Admitting: Medical

## 2013-07-19 ENCOUNTER — Ambulatory Visit (AMBULATORY_SURGERY_CENTER): Payer: Medicare Other

## 2013-07-19 VITALS — Ht 60.0 in | Wt 154.0 lb

## 2013-07-19 DIAGNOSIS — Z1211 Encounter for screening for malignant neoplasm of colon: Secondary | ICD-10-CM

## 2013-07-19 MED ORDER — MOVIPREP 100 G PO SOLR: 1.0000 | Freq: Once | ORAL | Status: DC

## 2013-07-19 MED ORDER — SUPREP BOWEL PREP KIT 17.5-3.13-1.6 GM/177ML PO SOLN: 1.0000 | Freq: Once | ORAL | Status: DC

## 2013-07-19 NOTE — Progress Notes (Signed)
No allergies to eggs or soy No home oxygen No diet/weight loss meds Has email. Emmi instructions given for colonoscopy No past problems with anesthesia. 

## 2013-07-26 ENCOUNTER — Telehealth: Payer: Self-pay | Admitting: Gastroenterology

## 2013-07-26 ENCOUNTER — Ambulatory Visit (INDEPENDENT_AMBULATORY_CARE_PROVIDER_SITE_OTHER): Payer: Medicare Other | Admitting: Pharmacist

## 2013-07-26 ENCOUNTER — Ambulatory Visit: Payer: Medicare Other | Admitting: Pharmacist

## 2013-07-26 VITALS — Wt 152.0 lb

## 2013-07-26 DIAGNOSIS — Z79899 Other long term (current) drug therapy: Secondary | ICD-10-CM | POA: Diagnosis not present

## 2013-07-26 DIAGNOSIS — E785 Hyperlipidemia, unspecified: Secondary | ICD-10-CM

## 2013-07-26 MED ORDER — SIMVASTATIN 40 MG PO TABS: 40.0000 mg | ORAL_TABLET | Freq: Every day | ORAL | Status: AC

## 2013-07-26 NOTE — Patient Instructions (Signed)
1.  Start taking Simvastatin 40 mg (generic Zocor) on daily.  Take it in the evening.  This was sent to [facility]. 2.  Start eating oatmeal every morning for breakfast. 3.  Continue active lifestyle. 4.  Recheck cholesterol in 3 months (10/17/13 - show up anytime after 7:30 am, but must be fasting).  See both Dr. Burt Knack and Alferd Apa on 10/19/13.  Dr. Burt Knack at 10:30 am, then Ysidro Evert at 11:00 to discuss cholesterol and possible study medication.

## 2013-07-26 NOTE — Assessment & Plan Note (Signed)
I called [facility] to confirm she could get a 90 day supply of simvastatin for $15, and she told me this would be affordable for her.  She is willing to enroll into PCSK-9 inhibitor study in 3 months if LDL still elevated.  She understands it is placebo controlled, and SQ injection q 2 weeks.  Cost is a major issue, and since there is no cost for it, she is agreeable.  She is going to start eating oatmeal every morning, and cut down on junk food.  Start simvastatin 40 mg qd and recheck blood work in 3 months (see me 2 days later).  May consider SPIRE study if needed at that time (CAD > 5 years ago, but diabetes and smoking would likely qualify.  She denies h/o cancer, thus should qualify for study).

## 2013-07-26 NOTE — Telephone Encounter (Signed)
Pt will come to [facility] 4th floor and pick up sample of Suprep

## 2013-07-26 NOTE — Telephone Encounter (Signed)
Pt does not think insurance will cover cost of Suprep.  She is checking with insurance co and will call back if she needs sample of Suprep.

## 2013-07-26 NOTE — Progress Notes (Signed)
Patient is a 68 y.o. AAF referred to lipid clinic by Ermalinda Barrios, PA-C / Dr. Burt Knack, given LDL of 240 mg/dL and not taking her statin.  Patient historically took simvastatin daily and tolerated this well.  She has not been taking simvastatin for a long time as she ran out of it and couldn't afford it.  She tells me it was going to cost > $50 at [facility] and she doesn't have that type of money.  [facility] actually sells simvastatin for $10 for a 90 day supply, so this could be an option.  Patient has a h/o CAD/PCI (1996), diabetes, and HTN. She currently smokes ~ 8 cigarettes per day and doesn't seem willing to quit.  Patient tells me that when she was working, she was eating 3 regimented meals daily, and eating oatmeal every morning.  Since she has retired, she skips meals often, and then eats a large meal in the evening, and not always healthy choices.  She is willing to go back on statin as long as it is affordable.  She is willing to consider PCSK-9 study given it is only q 2 weeks, and the medication is free.  She is currently trying to get her insurance situation straightened out, but until then she needs to get medications filled where they are the cheapest cash price.  The simvastatin is currently the only medication she has an issue with.  RF:  CAD, Diabetes, HTN, tobacco use, age - LDL goal < 100 given LDL at baseline is ~ 240 mg/dL.  LDL < 70 would be ideal. Meds:  Not on lipid lowering therapy currently. Intolerant:  None Took in the past:  Simvastatin - stopped due to cost.   Family history:  Father had multiple MIs.  Mother - diabetes.  Has 11 siblings, but none have CAD. Social history:  Rarely drinks alcohol.  Smokes 8 cigarettes per day Exercise:  Lives active lifestyle she tells me, but rarely does regularly scheduled aerobic activity.   Diet:  Skips meals typically until she eats a large meal in the evening.  Use to eat better a year ago when she was working, and she plans to start  eating better starting now.  Labs: 06/2013:  TC 306, TG 92, LDL 240, HDL 47 (not on therapy)  Current Outpatient Prescriptions  Medication Sig Dispense Refill  . acetaminophen (TYLENOL) 500 MG tablet Take 500 mg by mouth 3 (three) times daily as needed for pain. For pain      . carvedilol (COREG) 6.25 MG tablet Take 1 tablet (6.25 mg total) by mouth 2 (two) times daily with a meal.  60 tablet  11  . lisinopril (PRINIVIL,ZESTRIL) 20 MG tablet Take 1 tablet (20 mg total) by mouth daily.  90 tablet  0  . nitroGLYCERIN (NITROSTAT) 0.4 MG SL tablet Place 1 tablet (0.4 mg total) under the tongue every 5 (five) minutes as needed.  25 tablet  4  . SUPREP BOWEL PREP SOLN Take 1 kit by mouth once.  354 mL  0  . simvastatin (ZOCOR) 40 MG tablet Take 1 tablet (40 mg total) by mouth at bedtime.  90 tablet  3   No current facility-administered medications for this visit.   Allergies  Allergen Reactions  . Aspirin     GI Upset   Family History  Problem Relation Age of Onset  . Diabetes Mother   . Heart disease Father   . Heart attack Father   . Heart failure Father   .  COPD Father   . Heart disease Sister   . Diabetes Sister   . Colon cancer Neg Hx   . Pancreatic cancer Neg Hx   . Rectal cancer Neg Hx   . Stomach cancer Neg Hx

## 2013-08-02 ENCOUNTER — Ambulatory Visit (AMBULATORY_SURGERY_CENTER): Payer: Medicare Other | Admitting: Gastroenterology

## 2013-08-02 ENCOUNTER — Encounter: Payer: Self-pay | Admitting: Gastroenterology

## 2013-08-02 VITALS — BP 157/94 | HR 73 | Temp 97.7°F | Resp 19 | Ht 60.0 in | Wt 154.0 lb

## 2013-08-02 DIAGNOSIS — I10 Essential (primary) hypertension: Secondary | ICD-10-CM | POA: Diagnosis not present

## 2013-08-02 DIAGNOSIS — Z1211 Encounter for screening for malignant neoplasm of colon: Secondary | ICD-10-CM

## 2013-08-02 DIAGNOSIS — K648 Other hemorrhoids: Secondary | ICD-10-CM

## 2013-08-02 DIAGNOSIS — J449 Chronic obstructive pulmonary disease, unspecified: Secondary | ICD-10-CM | POA: Diagnosis not present

## 2013-08-02 DIAGNOSIS — I2510 Atherosclerotic heart disease of native coronary artery without angina pectoris: Secondary | ICD-10-CM | POA: Diagnosis not present

## 2013-08-02 MED ORDER — SODIUM CHLORIDE 0.9 % IV SOLN: 500.0000 mL | INTRAVENOUS | Status: DC

## 2013-08-02 NOTE — Patient Instructions (Signed)
YOU HAD AN ENDOSCOPIC PROCEDURE TODAY AT THE [facility]: Refer to the procedure report that was given to you for any specific questions about what was found during the examination.  If the procedure report does not answer your questions, please call your gastroenterologist to clarify.  If you requested that your care partner not be given the details of your procedure findings, then the procedure report has been included in a sealed envelope for you to review at your convenience later.  YOU SHOULD EXPECT: Some feelings of bloating in the abdomen. Passage of more gas than usual.  Walking can help get rid of the air that was put into your GI tract during the procedure and reduce the bloating. If you had a lower endoscopy (such as a colonoscopy or flexible sigmoidoscopy) you may notice spotting of blood in your stool or on the toilet paper. If you underwent a bowel prep for your procedure, then you may not have a normal bowel movement for a few days.  DIET: Your first meal following the procedure should be a light meal and then it is ok to progress to your normal diet.  A half-sandwich or bowl of soup is an example of a good first meal.  Heavy or fried foods are harder to digest and may make you feel nauseous or bloated.  Likewise meals heavy in dairy and vegetables can cause extra gas to form and this can also increase the bloating.  Drink plenty of fluids but you should avoid alcoholic beverages for 24 hours.  ACTIVITY: Your care partner should take you home directly after the procedure.  You should plan to take it easy, moving slowly for the rest of the day.  You can resume normal activity the day after the procedure however you should NOT DRIVE or use heavy machinery for 24 hours (because of the sedation medicines used during the test).    SYMPTOMS TO REPORT IMMEDIATELY: A gastroenterologist can be reached at any hour.  During normal business hours, 8:30 AM to 5:00 PM Monday through Friday,  call [phone].  After hours and on weekends, please call the GI answering service at [phone] who will take a message and have the physician on call contact you.   Following lower endoscopy (colonoscopy or flexible sigmoidoscopy):  Excessive amounts of blood in the stool  Significant tenderness or worsening of abdominal pains  Swelling of the abdomen that is new, acute  Fever of 100F or higher  FOLLOW UP: If any biopsies were taken you will be contacted by phone or by letter within the next 1-3 weeks.  Call your gastroenterologist if you have not heard about the biopsies in 3 weeks.  Our staff will call the home number listed on your records the next business day following your procedure to check on you and address any questions or concerns that you may have at that time regarding the information given to you following your procedure. This is a courtesy call and so if there is no answer at the home number and we have not heard from you through the emergency physician on call, we will assume that you have returned to your regular daily activities without incident.  SIGNATURES/CONFIDENTIALITY: You and/or your care partner have signed paperwork which will be entered into your electronic medical record.  These signatures attest to the fact that that the information above on your After Visit Summary has been reviewed and is understood.  Full responsibility of the confidentiality of this   discharge information lies with you and/or your care-partner.  Read handouts on hemorhoids.

## 2013-08-02 NOTE — Progress Notes (Signed)
To PACU awake and alert, report to RN. 

## 2013-08-02 NOTE — Op Note (Signed)
[facility]  [address] [place], [address]   COLONOSCOPY PROCEDURE REPORT  PATIENT: Morgan, Wyatt  MR#: [id number] BIRTHDATE: [birth year] , 56  yrs. old GENDER: Female ENDOSCOPIST: Inda Castle, MD REFERRED BY: PROCEDURE DATE:  08/02/2013 PROCEDURE:   Colonoscopy, diagnostic First Screening Colonoscopy - Avg.  risk and is 50 yrs.  old or older Yes.  Prior Negative Screening - Now for repeat screening. N/A  History of Adenoma - Now for follow-up colonoscopy & has been > or = to 3 yrs.  N/A  Polyps Removed Today? No.  Recommend repeat exam, <10 yrs? No. ASA CLASS:   Class II INDICATIONS:average risk screening. MEDICATIONS: MAC sedation, administered by CRNA and propofol (Diprivan) 200mg  IV  DESCRIPTION OF PROCEDURE:   After the risks benefits and alternatives of the procedure were thoroughly explained, informed consent was obtained.  A digital rectal exam revealed no abnormalities of the rectum.   The LB [id number] and LB [id number]  endoscope was introduced through the anus and advanced to the terminal ileum which was intubated for a short distance. No adverse events experienced.   The quality of the prep was excellent using Suprep  The instrument was then slowly withdrawn as the colon was fully examined.      COLON FINDINGS: Internal hemorrhoids were found.   A normal appearing cecum, ileocecal valve, and appendiceal orifice were identified.  The ascending, hepatic flexure, transverse, splenic flexure, descending, sigmoid colon and rectum appeared unremarkable.  No polyps or cancers were seen.  Retroflexed views revealed no abnormalities. The time to cecum=2 minutes 55 seconds. Withdrawal time=9 minutes 09 seconds.  The scope was withdrawn and the procedure completed. COMPLICATIONS: There were no complications.  ENDOSCOPIC IMPRESSION: 1.   Internal hemorrhoids 2.   Normal colon  RECOMMENDATIONS: Continue current colorectal  screening recommendations for "routine risk" patients with a repeat colonoscopy in 10 years.   eSigned:  Inda Castle, MD 08/02/2013 11:39 AM   cc: Stacy Gardner MD   PATIENT NAME:  Morgan, Wyatt MR#: [id number]

## 2013-08-03 ENCOUNTER — Telehealth: Payer: Self-pay | Admitting: *Deleted

## 2013-08-03 NOTE — Telephone Encounter (Signed)
  Follow up Call-  Call back number 08/02/2013  Post procedure Call Back phone  # [phone]  Permission to leave phone message Yes     Patient questions:  Left message to call if necessary.

## 2013-08-23 ENCOUNTER — Ambulatory Visit (INDEPENDENT_AMBULATORY_CARE_PROVIDER_SITE_OTHER): Payer: Medicare Other | Admitting: Medical

## 2013-08-23 ENCOUNTER — Encounter: Payer: Self-pay | Admitting: Medical

## 2013-08-23 VITALS — BP 172/91 | HR 80 | Ht 60.0 in | Wt 155.9 lb

## 2013-08-23 DIAGNOSIS — Z01419 Encounter for gynecological examination (general) (routine) without abnormal findings: Secondary | ICD-10-CM

## 2013-08-23 NOTE — Patient Instructions (Signed)
Menopause Menopause is the normal time of life when menstrual periods stop completely. Menopause is complete when you have missed 12 consecutive menstrual periods. It usually occurs between the ages of 8 years and 48 years. Very rarely does a woman develop menopause before the age of 60 years. At menopause, your ovaries stop producing the female hormones estrogen and progesterone. This can cause undesirable symptoms and also affect your health. Sometimes the symptoms may occur 4 5 years before the menopause begins. There is no relationship between menopause and:  Oral contraceptives.  Number of children you had.  Race.  The age your menstrual periods started (menarche). Heavy smokers and very thin women may develop menopause earlier in life. CAUSES  The ovaries stop producing the female hormones estrogen and progesterone.  Other causes include:  Surgery to remove both ovaries.  The ovaries stop functioning for no known reason.  Tumors of the pituitary gland in the brain.  Medical disease that affects the ovaries and hormone production.  Radiation treatment to the abdomen or pelvis.  Chemotherapy that affects the ovaries. SYMPTOMS   Hot flashes.  Night sweats.  Decrease in sex drive.  Vaginal dryness and thinning of the vagina causing painful intercourse.  Dryness of the skin and developing wrinkles.  Headaches.  Tiredness.  Irritability.  Memory problems.  Weight gain.  Bladder infections.  Hair growth of the face and chest.  Infertility. More serious symptoms include:  Loss of bone (osteoporosis) causing breaks (fractures).  Depression.  Hardening and narrowing of the arteries (atherosclerosis) causing heart attacks and strokes. DIAGNOSIS   When the menstrual periods have stopped for 12 straight months.  Physical exam.  Hormone studies of the blood. TREATMENT  There are many treatment choices and nearly as many questions about them. The  decisions to treat or not to treat menopausal changes is an individual choice made with your health care provider. Your health care provider can discuss the treatments with you. Together, you can decide which treatment will work best for you. Your treatment choices may include:   Hormone therapy (estrogen and progesterone).  Non-hormonal medicines.  Treating the individual symptoms with medicine (for example antidepressants for depression).  Herbal medicines that may help specific symptoms.  Counseling by a psychiatrist or psychologist.  Group therapy.  Lifestyle changes including:  Eating healthy.  Regular exercise.  Limiting caffeine and alcohol.  Stress management and meditation.  No treatment. HOME CARE INSTRUCTIONS   Take the medicine your health care provider gives you as directed.  Get plenty of sleep and rest.  Exercise regularly.  Eat a diet that contains calcium (good for the bones) and soy products (acts like estrogen hormone).  Avoid alcoholic beverages.  Do not smoke.  If you have hot flashes, dress in layers.  Take supplements, calcium, and vitamin D to strengthen bones.  You can use over-the-counter lubricants or moisturizers for vaginal dryness.  Group therapy is sometimes very helpful.  Acupuncture may be helpful in some cases. SEEK MEDICAL CARE IF:   You are not sure you are in menopause.  You are having menopausal symptoms and need advice and treatment.  You are still having menstrual periods after age 71 years.  You have pain with intercourse.  Menopause is complete (no menstrual period for 12 months) and you develop vaginal bleeding.  You need a referral to a specialist (gynecologist, psychiatrist, or psychologist) for treatment. SEEK IMMEDIATE MEDICAL CARE IF:   You have severe depression.  You have excessive vaginal bleeding.  You fell and think you have a broken bone.  You have pain when you urinate.  You develop leg or  chest pain.  You have a fast pounding heart beat (palpitations).  You have severe headaches.  You develop vision problems.  You feel a lump in your breast.  You have abdominal pain or severe indigestion. Document Released: 05/22/2003 Document Revised: 11/01/2012 Document Reviewed: 09/28/2012 [facility] Patient Information 2014 [place], [place].
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
## 2013-08-29 ENCOUNTER — Emergency Department (HOSPITAL_COMMUNITY)
Admission: EM | Admit: 2013-08-29 | Discharge: 2013-08-30 | Disposition: A | Discharge status: Home / Self Care | Payer: No Typology Code available for payment source | Attending: Emergency Medicine | Admitting: Emergency Medicine

## 2013-08-29 ENCOUNTER — Encounter (HOSPITAL_COMMUNITY): Payer: Self-pay | Admitting: Emergency Medicine

## 2013-08-29 DIAGNOSIS — S4980XA Other specified injuries of shoulder and upper arm, unspecified arm, initial encounter: Secondary | ICD-10-CM | POA: Insufficient documentation

## 2013-08-29 DIAGNOSIS — Z79899 Other long term (current) drug therapy: Secondary | ICD-10-CM | POA: Insufficient documentation

## 2013-08-29 DIAGNOSIS — I2510 Atherosclerotic heart disease of native coronary artery without angina pectoris: Secondary | ICD-10-CM | POA: Insufficient documentation

## 2013-08-29 DIAGNOSIS — M5380 Other specified dorsopathies, site unspecified: Secondary | ICD-10-CM | POA: Diagnosis not present

## 2013-08-29 DIAGNOSIS — M62838 Other muscle spasm: Secondary | ICD-10-CM | POA: Insufficient documentation

## 2013-08-29 DIAGNOSIS — Y9389 Activity, other specified: Secondary | ICD-10-CM | POA: Insufficient documentation

## 2013-08-29 DIAGNOSIS — S46909A Unspecified injury of unspecified muscle, fascia and tendon at shoulder and upper arm level, unspecified arm, initial encounter: Secondary | ICD-10-CM | POA: Insufficient documentation

## 2013-08-29 DIAGNOSIS — E785 Hyperlipidemia, unspecified: Secondary | ICD-10-CM | POA: Insufficient documentation

## 2013-08-29 DIAGNOSIS — Y92410 Unspecified street and highway as the place of occurrence of the external cause: Secondary | ICD-10-CM | POA: Insufficient documentation

## 2013-08-29 DIAGNOSIS — I10 Essential (primary) hypertension: Secondary | ICD-10-CM | POA: Insufficient documentation

## 2013-08-29 DIAGNOSIS — I252 Old myocardial infarction: Secondary | ICD-10-CM | POA: Insufficient documentation

## 2013-08-29 DIAGNOSIS — F17200 Nicotine dependence, unspecified, uncomplicated: Secondary | ICD-10-CM | POA: Insufficient documentation
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
## 2013-08-30 MED ORDER — HYDROCODONE-ACETAMINOPHEN 5-325 MG PO TABS: 1.0000 | ORAL_TABLET | ORAL | Status: DC | PRN
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
## 2013-08-30 NOTE — Discharge Instructions (Signed)
Take the prescribed medication as directed. °Follow-up with your primary care physician. °Return to the ED for new or worsening symptoms. ° °

## 2013-08-30 NOTE — ED Provider Notes (Signed)
Medical screening examination/treatment/procedure(s) were performed by non-physician practitioner and as supervising physician I was immediately available for consultation/collaboration.   EKG Interpretation None        Hoy Morn, MD 08/30/13 [phone]

## 2013-09-20 DIAGNOSIS — R3129 Other microscopic hematuria: Secondary | ICD-10-CM | POA: Diagnosis not present

## 2013-10-04 ENCOUNTER — Other Ambulatory Visit: Payer: Self-pay | Admitting: *Deleted

## 2013-10-04 NOTE — Telephone Encounter (Signed)
Pt called for cardiac refills

## 2013-10-10 ENCOUNTER — Other Ambulatory Visit: Payer: Self-pay

## 2013-10-10 MED ORDER — LISINOPRIL 20 MG PO TABS: 20.0000 mg | ORAL_TABLET | Freq: Every day | ORAL | Status: DC

## 2013-10-16 ENCOUNTER — Other Ambulatory Visit: Payer: Medicare PPO

## 2013-10-17 ENCOUNTER — Other Ambulatory Visit: Payer: Medicare PPO

## 2013-10-18 ENCOUNTER — Other Ambulatory Visit (INDEPENDENT_AMBULATORY_CARE_PROVIDER_SITE_OTHER): Payer: Medicare Other

## 2013-10-18 DIAGNOSIS — E785 Hyperlipidemia, unspecified: Secondary | ICD-10-CM

## 2013-10-18 DIAGNOSIS — Z79899 Other long term (current) drug therapy: Secondary | ICD-10-CM

## 2013-10-18 LAB — LIPID PANEL
Cholesterol: 289 mg/dL — ABNORMAL HIGH (ref 0–200)
HDL: 44.4 mg/dL (ref >39.00)
LDL Cholesterol: 213 mg/dL — ABNORMAL HIGH (ref 0–99)
NONHDL: 244.6
Total CHOL/HDL Ratio: 7
Triglycerides: 160 mg/dL — ABNORMAL HIGH (ref 0.0–149.0)
VLDL: 32 mg/dL (ref 0.0–40.0)

## 2013-10-18 LAB — HEPATIC FUNCTION PANEL
ALK PHOS: 134 U/L — AB (ref 39–117)
ALT: 19 U/L (ref 0–35)
AST: 20 U/L (ref 0–37)
Albumin: 3.6 g/dL (ref 3.5–5.2)
Bilirubin, Direct: 0 mg/dL (ref 0.0–0.3)
Total Bilirubin: 0.3 mg/dL (ref 0.2–1.2)
Total Protein: 7.6 g/dL (ref 6.0–8.3)

## 2013-10-19 ENCOUNTER — Ambulatory Visit (INDEPENDENT_AMBULATORY_CARE_PROVIDER_SITE_OTHER): Payer: Medicare Other | Admitting: Pharmacist

## 2013-10-19 ENCOUNTER — Ambulatory Visit (INDEPENDENT_AMBULATORY_CARE_PROVIDER_SITE_OTHER): Payer: Medicare Other | Admitting: Cardiovascular Disease

## 2013-10-19 ENCOUNTER — Encounter: Payer: Self-pay | Admitting: Cardiovascular Disease

## 2013-10-19 VITALS — BP 130/82 | HR 74 | Ht 60.0 in | Wt 161.0 lb

## 2013-10-19 DIAGNOSIS — I6529 Occlusion and stenosis of unspecified carotid artery: Secondary | ICD-10-CM

## 2013-10-19 DIAGNOSIS — I2510 Atherosclerotic heart disease of native coronary artery without angina pectoris: Secondary | ICD-10-CM

## 2013-10-19 DIAGNOSIS — Z79899 Other long term (current) drug therapy: Secondary | ICD-10-CM

## 2013-10-19 DIAGNOSIS — E785 Hyperlipidemia, unspecified: Secondary | ICD-10-CM | POA: Diagnosis not present

## 2013-10-19 DIAGNOSIS — I10 Essential (primary) hypertension: Secondary | ICD-10-CM | POA: Diagnosis not present

## 2013-10-19 MED ORDER — CARVEDILOL 6.25 MG PO TABS: 6.2500 mg | ORAL_TABLET | Freq: Two times a day (BID) | ORAL | Status: AC

## 2013-10-19 MED ORDER — LISINOPRIL 20 MG PO TABS
20.0000 mg | ORAL_TABLET | Freq: Every day | ORAL | Status: AC
Start: 2013-10-19 — End: ?

## 2013-10-19 MED ORDER — CLOPIDOGREL BISULFATE 75 MG PO TABS: 75.0000 mg | ORAL_TABLET | Freq: Every day | ORAL | Status: AC

## 2013-10-19 NOTE — Assessment & Plan Note (Signed)
Patient to start simvastatin 40 mg once daily today, and remain compliant with it. Given her LDL is 213 mg/dL on periodic simvastatin, will hopefully be able to get this well below 180 mg/dL if compliant.  SPIRE-II would be a good option for patient if we can get her on simvastatin 40 mg qd, and she agrees with this.  She knows about study, and is wanting to enroll in 6 weeks if possible.  She is to stay compliant with simvastatin 40 mg qd, recheck lipid / liver in 6 weeks, and see me the next day.  If LDL elevated, will enroll into SPIRE.

## 2013-10-19 NOTE — Progress Notes (Signed)
HPI:  68 year old woman presenting for followup evaluation. The patient has a remote history of coronary artery disease and underwent coronary stenting in 1996. She is also followed for hypertension, diabetes, and tobacco abuse. She had a nuclear stress test in 2012 after being hospitalized for chest pain. The study was negative for ischemia and showed a left ventricular ejection fraction of 59%.  The patient has no cardiac symptoms. She denies chest pain, shortness of breath, leg swelling, orthopnea, PND, or palpitations. She's had sharp pains on her right leg around the calf area intermittently over the last few weeks. No claudication symptoms reported. No redness or swelling of the legs.  She has not been taking her lipid lowering medicine regularly. She takes simvastatin at most 3 days per week. She reports no specific intolerance to this medication. She continues to smoke a few cigarettes daily.   Outpatient Encounter Prescriptions as of 10/19/2013  Medication Sig  . acetaminophen (TYLENOL) 500 MG tablet Take 500 mg by mouth 3 (three) times daily as needed for pain. For pain  . carvedilol (COREG) 6.25 MG tablet Take 1 tablet (6.25 mg total) by mouth 2 (two) times daily with a meal.  . lisinopril (PRINIVIL,ZESTRIL) 20 MG tablet Take 1 tablet (20 mg total) by mouth daily.  . simvastatin (ZOCOR) 40 MG tablet Take 1 tablet (40 mg total) by mouth at bedtime.  . [DISCONTINUED] HYDROcodone-acetaminophen (NORCO/VICODIN) 5-325 MG per tablet Take 1 tablet by mouth every 4 (four) hours as needed.  . [DISCONTINUED] lisinopril (PRINIVIL,ZESTRIL) 20 MG tablet Take 1 tablet (20 mg total) by mouth daily.    Allergies  Allergen Reactions  . Aspirin     GI Upset    Past Medical History  Diagnosis Date  . Coronary artery disease 1964    Status post prior stenting at [facility] (unknown vessel);  Myoview 12/2010:  EF 59%, no ischemia, small anterior wall scar vs breast attenuation  . HTN  (hypertension)   . Hyperlipidemia   . Tobacco abuse   . Arm numbness left     felt to be secondary to disk degeneration  . Myocardial infarction   . carotid stenosis 01/21/2011  . Chest pain 01/21/2011  . Incidental lung nodule, > 54mm and < 11mm 07/04/2012    Pulmonary nodule in the right upper lobe measures 4 mm. If the patient is at high risk for bronchogenic carcinoma, follow-up chest CT at 1 year is recommended.  If the patient is at low risk, no follow-up is needed.  This recommendation follows the consensus statement: Guidelines for Management of Small Pulmonary Nodules Detected on CT Scans:  A Statement from the [place] as published in Radiology 2005; 237:395-400.  For CT one year from 06/16/2012     ROS: Negative except as per HPI  BP 130/82  Pulse 74  Ht 5' (1.524 m)  Wt 161 lb (73.029 kg)  BMI 31.44 kg/m2  PHYSICAL EXAM: Pt is alert and oriented, NAD HEENT: normal Neck: JVP - normal, carotids 2+= without bruits Lungs: CTA bilaterally CV: RRR without murmur or gallop Abd: soft, NT, Positive BS, no hepatomegaly Ext: no C/C/E, distal pulses intact and equal Skin: On the lateral aspect of the right lower leg, there are tender varicosities.  EKG:  Normal sinus rhythm 74 beats per minute, nonspecific ST-T wave abnormality.  ASSESSMENT AND PLAN: 1. Coronary artery disease, native vessel. The patient is stable without symptoms of angina. She is intolerant to aspirin because of GI upset.  She cannot take even enteric coated baby aspirin. I have recommended that she start Plavix 75 mg daily for secondary prevention. She otherwise will continue on her current medical program.  2. Essential hypertension. Blood pressure well controlled on a combination of carvedilol and lisinopril.  3. Hyperlipidemia. Lipids remain completely uncontrolled. This is due to a combination of medication noncompliance and poor dietary habits. She is eating 2 bags of pork rinds every day. She's only  taking simvastatin a couple days per week. She has an appt in lipid clinic today and I will defer to them. Would expect significant improvement with taking Simvastatin daily and improving diet. Counseling was done today.  4. Tobacco - cessation counseling done.   For follow-up I will see her back in one year.  Sherren Mocha 10/19/2013 10:57 AM

## 2013-10-19 NOTE — Patient Instructions (Addendum)
1.  Take simvastatin 40 mg once daily - don't miss any more doses. 2.  Recheck cholesterol 12/05/13 - fasting labs (show up anytime after 7:30 am) 3.  See Ysidro Evert 12/06/13 at 11:00 am to review labs and hopefully enroll into trial

## 2013-10-19 NOTE — Patient Instructions (Signed)
Your physician has recommended you make the following change in your medication: START Plavix 75mg  take one by mouth daily  Your physician wants you to follow-up in: 1 YEAR with Dr Burt Knack.  You will receive a reminder letter in the mail two months in advance. If you don't receive a letter, please call our office to schedule the follow-up appointment.

## 2013-10-19 NOTE — Progress Notes (Signed)
Patient is a 68 y.o. AAF referred to lipid clinic by Ermalinda Barrios, PA-C / Dr. Burt Knack, given LDL of 240 mg/dL and not taking her statin.  Patient historically took simvastatin daily and tolerated this well, so was restarted on this 3 months ago.  She comes in today and tells me she is only taking it a few times per week and has been non-compliant with it.  She already had a long discussion with Dr. Burt Knack this morning, and is willing to take this everyday to lower cholesterol and risk. She has been tolerating it well, and didn't have a problem affording it.  Patient has a h/o CAD/PCI (1996), diabetes, and HTN. She currently smokes ~ 8 cigarettes per day and doesn't seem willing to quit.  Patient tells me that when she was working, she was eating 3 regimented meals daily, and eating oatmeal every morning.  Since she has retired, she skips meals often, and then eats a large meal in the evening, and not always healthy choices  She is willing to consider PCSK-9 study given it is only q 2 weeks, and the medication is free.  She is currently trying to get her insurance situation straightened out, but until then she needs to get medications filled where they are the cheapest cash price.    RF:  CAD, Diabetes, HTN, tobacco use, age - LDL goal < 100 given LDL at baseline is ~ 240 mg/dL.  LDL < 70 would be ideal. Meds: Simvastatin 40 mg qd - but non-compliant with this over past few months. Intolerant:  None Took in the past:  Zocor - stopped due to cost when brand name  Family history:  Father had multiple MIs.  Mother - diabetes.  Has 11 siblings, but none have CAD. Social history:  Rarely drinks alcohol.  Smokes 8 cigarettes per day Exercise:  Lives active lifestyle she tells me, but rarely does regularly scheduled aerobic activity.   Diet:  Skips meals typically until she eats a large meal in the evening.  Use to eat better a year ago when she was working, and she plans to start eating better starting  now.  Labs: 09/2013  TC 289, LDL 213, TG 160, HDL 44, LFTs okay (non-compliant with simvastatin 40 mg qd) 06/2013:  TC 306, TG 92, LDL 240, HDL 47 (not on therapy)  Current Outpatient Prescriptions  Medication Sig Dispense Refill  . acetaminophen (TYLENOL) 500 MG tablet Take 500 mg by mouth 3 (three) times daily as needed for pain. For pain      . carvedilol (COREG) 6.25 MG tablet Take 1 tablet (6.25 mg total) by mouth 2 (two) times daily with a meal.  180 tablet  3  . clopidogrel (PLAVIX) 75 MG tablet Take 1 tablet (75 mg total) by mouth daily.  90 tablet  3  . lisinopril (PRINIVIL,ZESTRIL) 20 MG tablet Take 1 tablet (20 mg total) by mouth daily.  90 tablet  3  . simvastatin (ZOCOR) 40 MG tablet Take 1 tablet (40 mg total) by mouth at bedtime.  90 tablet  3   No current facility-administered medications for this visit.   Allergies  Allergen Reactions  . Aspirin     GI Upset   Family History  Problem Relation Age of Onset  . Diabetes Mother   . Heart disease Father   . Heart attack Father   . Heart failure Father   . COPD Father   . Heart disease Sister   . Diabetes Sister   .  Colon cancer Neg Hx   . Pancreatic cancer Neg Hx   . Rectal cancer Neg Hx   . Stomach cancer Neg Hx   . Esophageal cancer Neg Hx

## 2013-10-20 ENCOUNTER — Encounter: Payer: Self-pay | Admitting: Cardiovascular Disease

## 2013-10-20 DIAGNOSIS — F17200 Nicotine dependence, unspecified, uncomplicated: Secondary | ICD-10-CM | POA: Diagnosis not present

## 2013-10-20 DIAGNOSIS — Z886 Allergy status to analgesic agent status: Secondary | ICD-10-CM | POA: Diagnosis not present

## 2013-10-20 DIAGNOSIS — M109 Gout, unspecified: Secondary | ICD-10-CM | POA: Diagnosis not present

## 2013-10-20 DIAGNOSIS — M19079 Primary osteoarthritis, unspecified ankle and foot: Secondary | ICD-10-CM | POA: Diagnosis not present

## 2013-10-20 DIAGNOSIS — M25473 Effusion, unspecified ankle: Secondary | ICD-10-CM | POA: Diagnosis not present

## 2013-10-20 DIAGNOSIS — M25579 Pain in unspecified ankle and joints of unspecified foot: Secondary | ICD-10-CM | POA: Diagnosis not present

## 2013-10-20 DIAGNOSIS — I10 Essential (primary) hypertension: Secondary | ICD-10-CM | POA: Diagnosis not present

## 2013-10-20 DIAGNOSIS — E7800 Pure hypercholesterolemia, unspecified: Secondary | ICD-10-CM | POA: Diagnosis not present

## 2013-10-23 ENCOUNTER — Telehealth: Payer: Self-pay | Admitting: Cardiovascular Disease

## 2013-10-23 NOTE — Telephone Encounter (Signed)
Walk in pt Form " FMLA" dropped Off Sent to [facility] 8.11.15/km

## 2013-10-30 DIAGNOSIS — N200 Calculus of kidney: Secondary | ICD-10-CM | POA: Diagnosis not present

## 2013-10-30 DIAGNOSIS — R3129 Other microscopic hematuria: Secondary | ICD-10-CM | POA: Diagnosis not present

## 2013-11-02 DIAGNOSIS — M76899 Other specified enthesopathies of unspecified lower limb, excluding foot: Secondary | ICD-10-CM | POA: Diagnosis not present

## 2013-11-02 DIAGNOSIS — M6740 Ganglion, unspecified site: Secondary | ICD-10-CM | POA: Diagnosis not present

## 2013-11-02 DIAGNOSIS — M5126 Other intervertebral disc displacement, lumbar region: Secondary | ICD-10-CM | POA: Diagnosis not present

## 2013-12-04 ENCOUNTER — Telehealth: Payer: Self-pay | Admitting: Cardiovascular Disease

## 2013-12-04 ENCOUNTER — Other Ambulatory Visit: Payer: 59

## 2013-12-04 NOTE — Telephone Encounter (Signed)
Called stating she wanted to know if she could have labs drawn in [facility], [place].  She is having to drive long distance 2 different days.  States she can have drawn at [facility] fax # [phone]. Advised will fax order for labs to be drawn there.  Will forward to [employer] to send orders.

## 2013-12-04 NOTE — Telephone Encounter (Signed)
New message  Pt requests a call back to discuss if there is a lab in [facility]. She is not sure that she wants to drive far for labs.. Please call //sr

## 2013-12-04 NOTE — Telephone Encounter (Signed)
Order for lipid panel and hepatic panel faxed to [facility].

## 2013-12-05 ENCOUNTER — Other Ambulatory Visit: Payer: 59

## 2013-12-05 ENCOUNTER — Encounter: Payer: Self-pay | Admitting: Pharmacist

## 2013-12-05 DIAGNOSIS — E785 Hyperlipidemia, unspecified: Secondary | ICD-10-CM | POA: Diagnosis not present

## 2013-12-05 DIAGNOSIS — Z79899 Other long term (current) drug therapy: Secondary | ICD-10-CM | POA: Diagnosis not present

## 2013-12-06 ENCOUNTER — Ambulatory Visit (INDEPENDENT_AMBULATORY_CARE_PROVIDER_SITE_OTHER): Payer: Medicare Other | Admitting: Pharmacist

## 2013-12-06 VITALS — Wt 167.0 lb

## 2013-12-06 DIAGNOSIS — E785 Hyperlipidemia, unspecified: Secondary | ICD-10-CM

## 2013-12-06 DIAGNOSIS — I6529 Occlusion and stenosis of unspecified carotid artery: Secondary | ICD-10-CM

## 2013-12-06 NOTE — Assessment & Plan Note (Signed)
Patient interested in enrolling into [facility].  She lives in [place] now, but is agreeable to drive up here when needed for blood work or screening.  She understands the potential benefit of PCSK-9 inhibitors, and wants to go this route due to no potential medication cost to her.  She understands it is SQ medication q 2 weeks and is placebo controlled.  She has a h/o CAD (1996) + diabetes + tobacco use which qualifies her for study.  She is on simvastatin 40 mg qd.  I've passed her information on to [employer] who will contact patient to come in for screening.

## 2013-12-06 NOTE — Progress Notes (Signed)
Patient is a 68 y.o. AAF referred to lipid clinic by Ermalinda Barrios, PA-C / Dr. Burt Knack, given LDL of 240 mg/dL off statin.  She has been taking her simvastatin 40 mg daily for the past 6 weeks and tolerating this well.  Her LDL has dropped down to 163 mg/dL.  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  No corneal arcus.  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  Patient historically took simvastatin daily in the past, and LDL remained ~ 200 mg/dL when she did this.  Now that she's taking simvastatin 40 mg daily her LDL is down to 160 mg/dL.  Finances are an issue and brand name agents aren't options at this time per patient.  She does not want to try a different statin as she is tolerating high dose simvastatin well currently.  She is interested in enrolling into SPIRE trial like we talked about in 10/2013.       Patient has a h/o CAD/PCI (1996), diabetes, and HTN. She currently smokes ~ 8 cigarettes per day and doesn't seem willing to quit.  These risk factors/risk conditions, along with LDL of 160 mg/dL on simvastatin 40 mg qd, qualify her for SPIRE.  She has no personal history of cancer.  Patient tells me that when she was working, she was eating 3 regimented meals daily, and eating oatmeal every morning.  She is now retired, and lives with her son and daughter in [place] and is cooking large dinners every night.  She has gained 10 lbs since doing this as she is eating more.  She is currently trying to get her insurance situation straightened out, but until then she needs to get medications filled where they are the cheapest cash price.  She likes SPIRE option as there is no cost.    RF:  CAD, Diabetes, HTN, tobacco use, age - LDL goal < 100 given LDL at baseline is ~ 240 mg/dL.  LDL < 70 would be ideal. Meds: Simvastatin 40 mg qd. Intolerant:  None Took in the past:  Zocor - stopped due to cost when brand name  Family history:  Father had multiple MIs.  Mother -  diabetes.  Has 11 siblings, but none have CAD. Social history:  Rarely drinks alcohol.  Smokes 8 cigarettes per day Exercise:  Lives active lifestyle she tells me, but rarely does regularly scheduled aerobic activity.   Diet:  Skips meals typically until she eats a large meal in the evening.    Labs: 11/2013 (from [place] - scanned into chart):  Tc 238, LDL 163, TG 158, HDL 43, ALT/AST normal (simvastatin 40 mg qd - compliant with this) 09/2013  TC 289, LDL 213, TG 160, HDL 44, LFTs okay (non-compliant with simvastatin 40 mg qd) 06/2013:  TC 306, TG 92, LDL 240, HDL 47 (not on therapy)  Current Outpatient Prescriptions  Medication Sig Dispense Refill  . acetaminophen (TYLENOL) 500 MG tablet Take 500 mg by mouth 3 (three) times daily as needed for pain. For pain      . carvedilol (COREG) 6.25 MG tablet Take 1 tablet (6.25 mg total) by mouth 2 (two) times daily with a meal.  180 tablet  3  . clopidogrel (PLAVIX) 75 MG tablet Take 1 tablet (75 mg total) by mouth daily.  90 tablet  3  . lisinopril (PRINIVIL,ZESTRIL) 20 MG tablet Take 1 tablet (20 mg total) by mouth daily.  90 tablet  3  . simvastatin (  ZOCOR) 40 MG tablet Take 1 tablet (40 mg total) by mouth at bedtime.  90 tablet  3   No current facility-administered medications for this visit.   Allergies  Allergen Reactions  . Aspirin     GI Upset   Family History  Problem Relation Age of Onset  . Diabetes Mother   . Heart disease Father   . Heart attack Father   . Heart failure Father   . COPD Father   . Heart disease Sister   . Diabetes Sister   . Colon cancer Neg Hx   . Pancreatic cancer Neg Hx   . Rectal cancer Neg Hx   . Stomach cancer Neg Hx   . Esophageal cancer Neg Hx

## 2014-01-14 ENCOUNTER — Encounter: Payer: Self-pay | Admitting: Cardiovascular Disease

## 2014-01-23 DIAGNOSIS — D1724 Benign lipomatous neoplasm of skin and subcutaneous tissue of left leg: Secondary | ICD-10-CM | POA: Diagnosis not present

## 2014-01-23 DIAGNOSIS — M5137 Other intervertebral disc degeneration, lumbosacral region: Secondary | ICD-10-CM | POA: Diagnosis not present

## 2014-01-23 DIAGNOSIS — M5126 Other intervertebral disc displacement, lumbar region: Secondary | ICD-10-CM | POA: Diagnosis not present

## 2014-01-23 DIAGNOSIS — M67472 Ganglion, left ankle and foot: Secondary | ICD-10-CM | POA: Diagnosis not present

## 2014-01-23 DIAGNOSIS — M7061 Trochanteric bursitis, right hip: Secondary | ICD-10-CM | POA: Diagnosis not present

## 2014-01-31 DIAGNOSIS — M5137 Other intervertebral disc degeneration, lumbosacral region: Secondary | ICD-10-CM | POA: Diagnosis not present

## 2014-02-12 DIAGNOSIS — M5137 Other intervertebral disc degeneration, lumbosacral region: Secondary | ICD-10-CM | POA: Diagnosis not present

## 2014-02-15 DIAGNOSIS — M5137 Other intervertebral disc degeneration, lumbosacral region: Secondary | ICD-10-CM | POA: Diagnosis not present

## 2014-02-18 DIAGNOSIS — M5137 Other intervertebral disc degeneration, lumbosacral region: Secondary | ICD-10-CM | POA: Diagnosis not present

## 2014-02-20 DIAGNOSIS — M5137 Other intervertebral disc degeneration, lumbosacral region: Secondary | ICD-10-CM | POA: Diagnosis not present

## 2014-02-21 DIAGNOSIS — M5137 Other intervertebral disc degeneration, lumbosacral region: Secondary | ICD-10-CM | POA: Diagnosis not present

## 2014-02-25 DIAGNOSIS — M5137 Other intervertebral disc degeneration, lumbosacral region: Secondary | ICD-10-CM | POA: Diagnosis not present

## 2014-03-01 DIAGNOSIS — M5137 Other intervertebral disc degeneration, lumbosacral region: Secondary | ICD-10-CM | POA: Diagnosis not present

## 2014-03-04 DIAGNOSIS — M5137 Other intervertebral disc degeneration, lumbosacral region: Secondary | ICD-10-CM | POA: Diagnosis not present

## 2014-04-22 ENCOUNTER — Other Ambulatory Visit: Payer: Self-pay | Admitting: Radiology

## 2014-04-22 DIAGNOSIS — I6523 Occlusion and stenosis of bilateral carotid arteries: Secondary | ICD-10-CM

## 2014-05-02 ENCOUNTER — Ambulatory Visit (HOSPITAL_COMMUNITY): Payer: Medicare PPO | Attending: Cardiovascular Disease | Admitting: Cardiology

## 2014-05-02 DIAGNOSIS — I6523 Occlusion and stenosis of bilateral carotid arteries: Secondary | ICD-10-CM | POA: Diagnosis not present

## 2014-05-02 NOTE — Progress Notes (Signed)
Carotid duplex performed 

## 2014-06-04 ENCOUNTER — Other Ambulatory Visit: Payer: Self-pay | Admitting: *Deleted

## 2014-06-04 DIAGNOSIS — R911 Solitary pulmonary nodule: Secondary | ICD-10-CM

## 2014-06-05 ENCOUNTER — Other Ambulatory Visit: Payer: Self-pay

## 2014-06-05 DIAGNOSIS — E0789 Other specified disorders of thyroid: Secondary | ICD-10-CM

## 2014-06-21 ENCOUNTER — Other Ambulatory Visit: Payer: 59

## 2014-06-27 ENCOUNTER — Ambulatory Visit
Admission: RE | Admit: 2014-06-27 | Discharge: 2014-06-27 | Disposition: A | Discharge status: Home / Self Care | Payer: Medicare PPO | Source: Ambulatory Visit | Attending: Cardiovascular Disease | Admitting: Cardiovascular Disease

## 2014-06-27 DIAGNOSIS — E0789 Other specified disorders of thyroid: Secondary | ICD-10-CM

## 2014-07-03 ENCOUNTER — Other Ambulatory Visit: Payer: Self-pay | Admitting: *Deleted

## 2014-07-04 ENCOUNTER — Other Ambulatory Visit: Payer: 59

## 2014-07-04 ENCOUNTER — Ambulatory Visit: Payer: 59 | Admitting: Cardiothoracic Surgery

## 2014-07-05 ENCOUNTER — Inpatient Hospital Stay: Admission: RE | Admit: 2014-07-05 | Payer: 59 | Source: Ambulatory Visit

## 2014-07-05 ENCOUNTER — Ambulatory Visit: Payer: Medicare PPO | Admitting: Cardiothoracic Surgery

## 2014-07-10 ENCOUNTER — Ambulatory Visit: Payer: Medicare PPO | Admitting: Cardiothoracic Surgery

## 2014-07-12 ENCOUNTER — Other Ambulatory Visit: Payer: Medicare PPO

## 2014-07-12 ENCOUNTER — Ambulatory Visit: Payer: Medicare PPO | Admitting: Cardiothoracic Surgery

## 2014-07-25 ENCOUNTER — Encounter: Payer: Self-pay | Admitting: Cardiothoracic Surgery

## 2014-07-25 ENCOUNTER — Ambulatory Visit (INDEPENDENT_AMBULATORY_CARE_PROVIDER_SITE_OTHER): Payer: Medicare PPO | Admitting: Cardiothoracic Surgery

## 2014-07-25 ENCOUNTER — Ambulatory Visit
Admission: RE | Admit: 2014-07-25 | Discharge: 2014-07-25 | Disposition: A | Discharge status: Home / Self Care | Payer: Medicare PPO | Source: Ambulatory Visit | Attending: Cardiothoracic Surgery | Admitting: Cardiothoracic Surgery

## 2014-07-25 VITALS — BP 149/79 | HR 89 | Resp 16 | Ht 60.0 in | Wt 166.0 lb

## 2014-07-25 DIAGNOSIS — D381 Neoplasm of uncertain behavior of trachea, bronchus and lung: Secondary | ICD-10-CM

## 2014-07-25 DIAGNOSIS — R911 Solitary pulmonary nodule: Secondary | ICD-10-CM

## 2014-07-25 NOTE — Progress Notes (Signed)
[address]       [place] [address]             [phone]         [name] [facility] Medical Record #[id number] Date of Birth: December 07, 1945  Referring: Aura Dials, MD Primary Care: same  Chief Complaint:    Chief Complaint  Patient presents with  . Lung Lesion    1 yr. f/u with LOW DOSE CT CHEST    History of Present Illness:    Patient is a 69 year old female with a 20-25 year history of smoking 5-6 cigarettes a day who was  referred for the incidental finding of a 4 mm lung nodule in the right upper lobe 06/2012. The patient notes that she fell in a store on 05/24/2012 when her pants leg got caught on a pallet and she fell on the floor. She notes about 6 days after she fell she had increasing anterior chest wall pain and shortness of breath and [facility]. She was evaluated that time and told she had not had a myocardial infarction but she does have a history of coronary artery disease. A CT scan of the chest down the incidental 4 mm right upper lobe lung nodule without other evidence of lung cancer. The radiographic recommendation was for a CT scan of the chest in one year. The patient wished to see a thoracic surgeon before she went along with this recommendation. She was seen and repeat scan this year was recommended. Patient returns today with repeat ct scan of the chest. She has returned to smoking.     Current Activity/ Functional Status:  Patient is independent with mobility/ambulation, transfers, ADL's, IADL's.  Zubrod Score: At the time of surgery this patient's most appropriate activity status/level should be described as: [x]  Normal activity, no symptoms []  Symptoms, fully ambulatory []  Symptoms, in bed less than or equal to 50% of the time []  Symptoms, in bed greater than 50% of the time but less than 100% []  Bedridden []  Moribund   Past Medical History  Diagnosis Date  . Coronary artery disease 1966     Status post prior stenting at [facility] (unknown vessel);  Myoview 12/2010:  EF 59%, no ischemia, small anterior wall scar vs breast attenuation  . HTN (hypertension)   . Hyperlipidemia   . Tobacco abuse   . Arm numbness left     felt to be secondary to disk degeneration  . Myocardial infarction   . carotid stenosis 01/21/2011  . Chest pain 01/21/2011  . Incidental lung nodule, > 90mm and < 30mm 07/04/2012    Pulmonary nodule in the right upper lobe measures 4 mm. If the patient is at high risk for bronchogenic carcinoma, follow-up chest CT at 1 year is recommended.  If the patient is at low risk, no follow-up is needed.  This recommendation follows the consensus statement: Guidelines for Management of Small Pulmonary Nodules Detected on CT Scans:  A Statement from the [employer] as published in Radiology 2005; 237:395-400.  For CT one year from 06/16/2012     Past Surgical History  Procedure Laterality Date  . Other surgical history       HYSTERECTOMY  . Coronary stent placement    . Abdominal hysterectomy    . Hiatal hernia repair      Family History  Problem Relation Age of Onset  . Diabetes Mother   .  Heart disease Father   . Heart attack Father   . Heart failure Father   . COPD Father     History   Social History  . Marital Status: Legally Separated    Spouse Name: N/A    Number of Children: N/A  . Years of Education: N/A   Occupational History  . Not on file.   Social History Main Topics  . Smoking status: Current Every Day Smoker  . Smokeless tobacco: Not on file  . Alcohol Use: No  . Drug Use: No         History  Smoking status  . Current Every Day Smoker -- 0.50 packs/day  Smokeless tobacco  . Never Used    History  Alcohol Use No     Allergies  Allergen Reactions  . Aspirin     GI Upset    Current Outpatient Prescriptions  Medication Sig Dispense Refill  . acetaminophen (TYLENOL) 500 MG tablet Take 500 mg by mouth 3 (three) times  daily as needed for pain. For pain    . carvedilol (COREG) 6.25 MG tablet Take 1 tablet (6.25 mg total) by mouth 2 (two) times daily with a meal. 180 tablet 3  . clopidogrel (PLAVIX) 75 MG tablet Take 1 tablet (75 mg total) by mouth daily. 90 tablet 3  . lisinopril (PRINIVIL,ZESTRIL) 20 MG tablet Take 1 tablet (20 mg total) by mouth daily. 90 tablet 3  . simvastatin (ZOCOR) 40 MG tablet Take 1 tablet (40 mg total) by mouth at bedtime. 90 tablet 3   No current facility-administered medications for this visit.       Review of Systems:     Cardiac Review of Systems: Y or N  Chest Pain [  n  ]  Resting SOB [  n ] Exertional SOB  [ n ]  Orthopnea [ n ]   Pedal Edema [ n  ]    Palpitations [ n ] Syncope  [ n ]   Presyncope [  n ]  General Review of Systems: [Y] = yes [  ]=no Constitional: recent weight change [ n ]; anorexia [  ]; fatigue [  ]; nausea [  ]; night sweats [  ]; fever [  ]; or chills [  ];                                                                                                                                          Dental: poor dentition[no teeth  ]; Last Dentist visit:   Eye : blurred vision [  ]; diplopia [   ]; vision changes [  ];  Amaurosis fugax[  ]; Resp: cough [  ];  wheezing[n  ];  hemoptysis[ n ]; shortness of breath[ n ]; paroxysmal nocturnal dyspnea[ n ]; dyspnea on exertion[  ]; or orthopnea[  ];  GI:  gallstones[ on ct  scan ], vomiting[  ];  dysphagia[  ]; melena[  ];  hematochezia [  ]; heartburn[  ];   Hx of  Colonoscopy[ to be done Jul 31 2013 ]; gets mammogram yearly had this month GU: kidney stones [  ]; hematuria[  ];   dysuria [  ];  nocturia[  ];  history of     obstruction [  ]; urinary frequency [  ]             Skin: rash, swelling[  ];, hair loss[  ];  peripheral edema[  ];  or itching[  ]; Musculosketetal: myalgias[  ];  joint swelling[  ];  joint erythema[  ];  joint pain[  ];  back pain[  ];  Heme/Lymph: bruising[  ];  bleeding[  ];  anemia[   ];  Neuro: TIA[  ];  headaches[  ];  stroke[ n ];  vertigo[  ];  seizures[  ];   paresthesias[  ];  difficulty walking[ n ];  Psych:depression[  ]; anxiety[  ];  Endocrine: diabetes[ n ];  thyroid dysfunction[  ];  Immunizations: Flu [does not take ]; Pneumococcal[ does not take ];  Other:  Physical Exam: BP 149/79 mmHg  Pulse 89  Resp 16  Ht 5' (1.524 m)  Wt 166 lb (75.297 kg)  BMI 32.42 kg/m2  SpO2 98%  General appearance: alert, cooperative, appears stated age and no distress Neurologic: intact Heart: regular rate and rhythm, S1, S2 normal, no murmur, click, rub or gallop and normal apical impulse Lungs: clear to auscultation bilaterally and normal percussion bilaterally Abdomen: soft, non-tender; bowel sounds normal; no masses,  no organomegaly Extremities: extremities normal, atraumatic, no cyanosis or edema and Homans sign is negative, no sign of DVT Patient has known carotid disease, I do not appreciate any carotid bruits  Diagnostic Studies & Laboratory data:     Recent Radiology Findings: Ct Chest Nodule Follow Up Low Dose W/o  07/25/2014   CLINICAL DATA:  Follow-up pulmonary nodules  EXAM: CT CHEST WITHOUT CONTRAST  TECHNIQUE: Multidetector CT imaging of the chest was performed following the standard protocol without IV contrast.  COMPARISON:  CT chest dated 07/12/2013 and 06/16/2012  FINDINGS: Mediastinum/Nodes: The heart is normal in size. No pericardial effusion.  Three vessel coronary atherosclerosis.  Atherosclerotic calcifications of the aortic arch.  No suspicious mediastinal lymphadenopathy.  Visualized thyroid is unremarkable.  Lungs/Pleura: 4 mm ground-glass nodule in the right upper lobe (series 4/ image 10), unchanged since 2014, benign.  Additional subpleural ground-glass opacity/reticulation in the lateral right upper lobe (series 4/image 20), improved, possibly reflecting scarring.  Mild dependent atelectasis in the bilateral lower lobes with possible subpleural  scarring in the lateral left lung base (series 4/ image 35).  Mild centrilobular and paraseptal emphysematous changes, upper lobe predominant.  No pleural effusion or pneumothorax.  Upper abdomen: Visualized upper abdomen is within normal limits.  Musculoskeletal: Mild degenerative changes of the thoracic spine.  IMPRESSION: 4 mm right upper lobe nodule, unchanged since 2014, benign.  No dedicated follow-up imaging is required per Fleischner Society guidelines.  This recommendation follows the consensus statement: Guidelines for Management of Small Pulmonary Nodules Detected on CT Scans: A Statement from the [employer] as published in Radiology 2005; 237:395-400.   Electronically Signed   By: Julian Hy M.D.   On: 07/25/2014 10:35   I have independently reviewed the above radiology studies  and reviewed the findings with the patient.   Ct Chest W  Contrast  07/12/2013   CLINICAL DATA:  Followup pulmonary nodule  EXAM: CT CHEST WITH CONTRAST  TECHNIQUE: Multidetector CT imaging of the chest was performed during intravenous contrast administration.  CONTRAST:  33mL OMNIPAQUE IOHEXOL 300 MG/ML  SOLN  COMPARISON:  CT CHEST W/O CM dated 06/16/2012; DG CHEST 2 VIEW dated 06/08/2012  FINDINGS: Stable 3-4 mm nodule right lung apex. Incidentally detected 14 mm right thyroid nodule. No suspicious features. Aortic arch and coronary arterial calcification with calcification of descending thoracic aorta, all stable. 3 mm left nonobstructing renal stones stable. Similar to prior study there is mild to moderate thickening of the wall of the distal esophagus.  No significant adenopathy, parenchymal consolidation or effusion, or pleural or pericardial effusion. No acute musculoskeletal findings.  IMPRESSION: Stable 4 mm right apical pulmonary nodule, which can be considered benign given the stability. This recommendation follows the consensus statement: Guidelines for Management of Small Pulmonary Nodules Detected  on CT Scans: A Statement from the [employer] as published in Radiology 2005; 237:395-400.   Electronically Signed   By: Skipper Cliche M.D.   On: 07/12/2013 09:23    Dg Chest 2 View  06/08/2012  *RADIOLOGY REPORT*  Clinical Data: [place].  Chest pain.  CHEST - 2 VIEW  Comparison: 01/08/2011.  Findings: The cardiac silhouette, mediastinal and hilar contours are normal and stable.  The lungs are clear.  No pleural effusion, pleural thickening or pneumothorax.  The bony thorax is intact.  IMPRESSION: No acute cardiopulmonary findings and intact bony thorax.   Original Report Authenticated By: Marijo Sanes, M.D.    Ct Chest Wo Contrast  06/16/2012  *RADIOLOGY REPORT*  Clinical Data: Contusion to sternum and ribs  CT CHEST WITHOUT CONTRAST  Technique:  Multidetector CT imaging of the chest was performed following the standard protocol without IV contrast.  Comparison: None  Findings:  Lungs/pleura: There is no pleural effusion.  There is no airspace consolidation or atelectasis identified.  No evidence for pulmonary contusion.  Right upper lobe nodule measures 4 mm, image 9/series 3.  Heart/Mediastinum: The heart size is normal.  No pericardial effusion.  Calcification within the LAD coronary artery noted. There is no enlarged mediastinal or hilar lymph nodes identified. Left-sided superior vena cava is noted.  Upper abdomen: There is a 3 mm stone within the upper pole of the left kidney, image 53/series 2.  Bones/Musculoskeletal:  No supraclavicular or axillary adenopathy. The sternum appears intact.  IMPRESSION:  1.  No acute findings. 2.  Pulmonary nodule in the right upper lobe measures 4 mm. If the patient is at high risk for bronchogenic carcinoma, follow-up chest CT at 1 year is recommended.  If the patient is at low risk, no follow-up is needed.  This recommendation follows the consensus statement: Guidelines for Management of Small Pulmonary Nodules Detected on CT Scans:  A Statement from the  [employer] as published in Radiology 2005; 237:395-400. 3.  Coronary artery calcifications.   Original Report Authenticated By: Kerby Moors, M.D.     Recent Lab Findings: Lab Results  Component Value Date   WBC 7.3 06/28/2013   HGB 13.5 06/28/2013   HCT 39.9 06/28/2013   PLT 257.0 06/28/2013   GLUCOSE 104* 06/28/2013   CHOL 289* 10/18/2013   TRIG 160.0* 10/18/2013   HDL 44.40 10/18/2013   LDLDIRECT 168.3 06/01/2011   LDLCALC 213* 10/18/2013   ALT 19 10/18/2013   AST 20 10/18/2013   NA 138 06/28/2013   K 3.4* 06/28/2013  CL 104 06/28/2013   CREATININE 0.9 06/28/2013   BUN 16 06/28/2013   CO2 29 06/28/2013   TSH 1.00 06/28/2013   INR 0.92 01/08/2011   HGBA1C 6.2* 01/08/2011   10 minute of the office visit was spent discussing with the patient strategies to stop smoking, and to help me to do so.    Assessment / Plan:   Pulmonary Nodule in the right upper lobe measures 4 mm- first noted on CT scan 2014 now unchanged one year later pursuing to be benign. Because of the patient's risk of lung cancer she will be referred to the lung cancer screening program for additional low-dose CT scans for screening.  History of tobacco use, patient was referred to the outpatient smoking cessation class, she quit smoking for a period of time but now has resumed  Known CAD with stent place at [facility], coronary calcification on CT of Chest- denies any current chest pain Hypertension. - on  Coreg to 6.25 mg twice daily and  lisinopril 20 mg daily. Blood pressure is borderline today- I discussed with the patient the need for good blood pressure control and the importance of taking her blood pressure medicine as prescribed she noted she is currently run out of it and did not take it this morning. Dyslipidemia. On Zocor but is unclear she's actually taking History of Carotid stenosis. The patient had 40-59% bilateral ICA stenosis asymptomatic Will not schedule full dose CT scan of the  chest for the 28mm lesion that is present see radiology recommendation on ct report  Grace Isaac MD  Beeper [phone] Office [phone] 07/25/2014 12:28 PM   Follow up: Lung Cancer Screening clinic declined to enroll patient:  Expand All Collapse All   I received a message with a request that [name] be followed for her pulmonary nodule in the Lung Cancer Screening Program by Dr. Servando Snare. Unfortunately, based on multiple notes in Epic, this patient does not have a 30 pack year smoking history, and therefore does not qualify for the program. I have left both a phone message and a staff message for Micki Riley, who made the referral, explaining that this patient does not meet criteria for the program.I am happy to help evaluate any referrals in the future for inclusion criteria.

## 2014-07-26 ENCOUNTER — Telehealth: Payer: Self-pay | Admitting: Acute Care

## 2014-07-26 NOTE — Telephone Encounter (Signed)
I received a message with a request that [name] be followed for her pulmonary nodule in the Lung Cancer Screening Program by Dr. Servando Snare. Unfortunately, based on multiple notes in Epic, this patient does not have a 30 pack year smoking history, and therefore does not qualify for the program. I have left both a phone message and a staff message for [name], who made the referral, explaining that this patient does not meet criteria for the program.I am happy to help evaluate any referrals in the future for inclusion criteria.

## 2015-12-04 ENCOUNTER — Ambulatory Visit: Payer: Medicare PPO | Admitting: Cardiovascular Disease

## 2015-12-05 ENCOUNTER — Encounter: Payer: Self-pay | Admitting: Cardiovascular Disease
